# Patient Record
Sex: Female | Born: 1950 | Race: White | Hispanic: No | Smoking: Former smoker
Health system: Southern US, Community
[De-identification: ages and names within clinical notes are randomized; demographics above are authoritative.]

## PROBLEM LIST (undated history)

## (undated) ENCOUNTER — Ambulatory Visit (HOSPITAL_BASED_OUTPATIENT_CLINIC_OR_DEPARTMENT_OTHER): Admission: EM | Source: Home / Self Care

## (undated) DIAGNOSIS — Z789 Other specified health status: Secondary | ICD-10-CM

## (undated) DIAGNOSIS — F329 Major depressive disorder, single episode, unspecified: Secondary | ICD-10-CM

## (undated) DIAGNOSIS — E119 Type 2 diabetes mellitus without complications: Secondary | ICD-10-CM

## (undated) DIAGNOSIS — E785 Hyperlipidemia, unspecified: Secondary | ICD-10-CM

## (undated) DIAGNOSIS — K219 Gastro-esophageal reflux disease without esophagitis: Secondary | ICD-10-CM

## (undated) DIAGNOSIS — F32A Depression, unspecified: Secondary | ICD-10-CM

## (undated) DIAGNOSIS — I519 Heart disease, unspecified: Secondary | ICD-10-CM

## (undated) DIAGNOSIS — I2510 Atherosclerotic heart disease of native coronary artery without angina pectoris: Secondary | ICD-10-CM

## (undated) DIAGNOSIS — I6529 Occlusion and stenosis of unspecified carotid artery: Secondary | ICD-10-CM

## (undated) DIAGNOSIS — J449 Chronic obstructive pulmonary disease, unspecified: Secondary | ICD-10-CM

## (undated) DIAGNOSIS — F419 Anxiety disorder, unspecified: Secondary | ICD-10-CM

## (undated) DIAGNOSIS — K589 Irritable bowel syndrome without diarrhea: Secondary | ICD-10-CM

## (undated) DIAGNOSIS — I739 Peripheral vascular disease, unspecified: Secondary | ICD-10-CM

## (undated) DIAGNOSIS — M1990 Unspecified osteoarthritis, unspecified site: Secondary | ICD-10-CM

## (undated) DIAGNOSIS — E1169 Type 2 diabetes mellitus with other specified complication: Secondary | ICD-10-CM

## (undated) DIAGNOSIS — M797 Fibromyalgia: Secondary | ICD-10-CM

## (undated) DIAGNOSIS — E538 Deficiency of other specified B group vitamins: Secondary | ICD-10-CM

## (undated) DIAGNOSIS — C801 Malignant (primary) neoplasm, unspecified: Secondary | ICD-10-CM

## (undated) DIAGNOSIS — H3530 Unspecified macular degeneration: Secondary | ICD-10-CM

## (undated) DIAGNOSIS — Z6836 Body mass index (BMI) 36.0-36.9, adult: Secondary | ICD-10-CM

## (undated) DIAGNOSIS — I10 Essential (primary) hypertension: Secondary | ICD-10-CM

## (undated) HISTORY — DX: Body mass index (BMI) 36.0-36.9, adult: Z68.36

## (undated) HISTORY — DX: Atherosclerotic heart disease of native coronary artery without angina pectoris: I25.10

## (undated) HISTORY — DX: Peripheral vascular disease, unspecified: I73.9

## (undated) HISTORY — PX: CARDIAC CATHETERIZATION: SHX172

## (undated) HISTORY — DX: Malignant (primary) neoplasm, unspecified: C80.1

## (undated) HISTORY — PX: SKIN BIOPSY: SHX1

## (undated) HISTORY — DX: Type 2 diabetes mellitus without complications: E11.9

## (undated) HISTORY — DX: Unspecified osteoarthritis, unspecified site: M19.90

## (undated) HISTORY — PX: SMALL INTESTINE SURGERY: SHX150

## (undated) HISTORY — DX: Fibromyalgia: M79.7

## (undated) HISTORY — DX: Unspecified macular degeneration: H35.30

## (undated) HISTORY — DX: Irritable bowel syndrome, unspecified: K58.9

## (undated) HISTORY — PX: OTHER SURGICAL HISTORY: SHX169

## (undated) HISTORY — DX: Occlusion and stenosis of unspecified carotid artery: I65.29

## (undated) HISTORY — DX: Heart disease, unspecified: I51.9

## (undated) HISTORY — DX: Type 2 diabetes mellitus with other specified complication: E11.69

## (undated) HISTORY — PX: TUBAL LIGATION: SHX77

## (undated) HISTORY — DX: Other specified health status: Z78.9

## (undated) HISTORY — DX: Deficiency of other specified B group vitamins: E53.8

## (undated) HISTORY — PX: EYE SURGERY: SHX253

## (undated) HISTORY — DX: Type 2 diabetes mellitus with other specified complication: E78.5

---
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
## 2003-08-18 ENCOUNTER — Encounter: Admission: RE | Admit: 2003-08-18 | Discharge: 2003-08-18 | Payer: Self-pay | Admitting: Obstetrics and Gynecology

## 2015-07-08 DIAGNOSIS — Z95820 Peripheral vascular angioplasty status with implants and grafts: Secondary | ICD-10-CM

## 2015-07-08 DIAGNOSIS — E669 Obesity, unspecified: Secondary | ICD-10-CM | POA: Insufficient documentation

## 2015-07-08 DIAGNOSIS — M21611 Bunion of right foot: Secondary | ICD-10-CM | POA: Insufficient documentation

## 2015-07-08 DIAGNOSIS — D649 Anemia, unspecified: Secondary | ICD-10-CM

## 2015-07-08 DIAGNOSIS — M545 Low back pain, unspecified: Secondary | ICD-10-CM

## 2015-07-08 DIAGNOSIS — G5793 Unspecified mononeuropathy of bilateral lower limbs: Secondary | ICD-10-CM

## 2015-07-08 DIAGNOSIS — Z9989 Dependence on other enabling machines and devices: Secondary | ICD-10-CM | POA: Insufficient documentation

## 2015-07-08 DIAGNOSIS — R911 Solitary pulmonary nodule: Secondary | ICD-10-CM | POA: Insufficient documentation

## 2015-07-08 DIAGNOSIS — I771 Stricture of artery: Secondary | ICD-10-CM

## 2015-07-08 DIAGNOSIS — I6523 Occlusion and stenosis of bilateral carotid arteries: Secondary | ICD-10-CM | POA: Insufficient documentation

## 2015-07-08 HISTORY — DX: Obesity, unspecified: E66.9

## 2015-07-08 HISTORY — DX: Occlusion and stenosis of bilateral carotid arteries: I65.23

## 2015-07-08 HISTORY — DX: Dependence on other enabling machines and devices: Z99.89

## 2015-07-08 HISTORY — DX: Bunion of right foot: M21.611

## 2015-07-08 HISTORY — DX: Solitary pulmonary nodule: R91.1

## 2015-07-08 HISTORY — DX: Unspecified mononeuropathy of bilateral lower limbs: G57.93

## 2015-07-08 HISTORY — DX: Stricture of artery: I77.1

## 2015-07-08 HISTORY — DX: Peripheral vascular angioplasty status with implants and grafts: Z95.820

## 2015-07-08 HISTORY — DX: Anemia, unspecified: D64.9

## 2015-07-08 HISTORY — DX: Low back pain, unspecified: M54.50

## 2015-12-05 DIAGNOSIS — E1141 Type 2 diabetes mellitus with diabetic mononeuropathy: Secondary | ICD-10-CM

## 2015-12-05 HISTORY — DX: Type 2 diabetes mellitus with diabetic mononeuropathy: E11.41

## 2016-02-21 DIAGNOSIS — G2581 Restless legs syndrome: Secondary | ICD-10-CM

## 2016-02-21 HISTORY — DX: Restless legs syndrome: G25.81

## 2017-08-28 DIAGNOSIS — L84 Corns and callosities: Secondary | ICD-10-CM | POA: Insufficient documentation

## 2017-08-28 HISTORY — DX: Corns and callosities: L84

## 2018-08-11 DIAGNOSIS — I6523 Occlusion and stenosis of bilateral carotid arteries: Secondary | ICD-10-CM | POA: Insufficient documentation

## 2018-08-11 HISTORY — DX: Occlusion and stenosis of bilateral carotid arteries: I65.23

## 2018-11-03 ENCOUNTER — Telehealth: Payer: Self-pay | Admitting: Neurology

## 2018-11-03 ENCOUNTER — Encounter: Payer: Self-pay | Admitting: Neurology

## 2018-11-03 NOTE — Telephone Encounter (Signed)
Called the patient to inform them that our office has placed new protocols in place for our office visits. Due to Covid 19 our office is reducing our number of office visits in order to minimize the risk to our patients and healthcare providers.Our office is now providing the capability to offer the patients virtual visits at this time. Informed of what that process looks like and informed that the Virtual visit will still be billed through insurance as such. Due to [place],informed the patient since the appointment is taking place over the phone/internet app, we can't guarantee the security of the phone line. With that said if we do move forward I would have to get verbal consent to completed the call over the phone. Patient gave verbal consent to move forward with the video visit. I have reviewed the patient's chart and made sure that everything is up to date. Patient is also made aware that since this is a video visit we are able to complete the visit but a physical exam is not able to be done since the patient is not present in person. Pt's email is [email or web address]. Pt understands that the cis[employer] webex software must be downloaded and operational on the device pt plans to use for the visit. Pt verbalized understanding of this information and will states to be ready for the visit at least 15 min prior to the visit.

## 2018-11-10 ENCOUNTER — Encounter: Payer: Self-pay | Admitting: Neurology

## 2018-11-10 ENCOUNTER — Ambulatory Visit (INDEPENDENT_AMBULATORY_CARE_PROVIDER_SITE_OTHER): Payer: Medicare Other | Admitting: Neurology

## 2018-11-10 DIAGNOSIS — J449 Chronic obstructive pulmonary disease, unspecified: Secondary | ICD-10-CM

## 2018-11-10 DIAGNOSIS — E113293 Type 2 diabetes mellitus with mild nonproliferative diabetic retinopathy without macular edema, bilateral: Secondary | ICD-10-CM

## 2018-11-10 DIAGNOSIS — Z959 Presence of cardiac and vascular implant and graft, unspecified: Secondary | ICD-10-CM

## 2018-11-10 DIAGNOSIS — I739 Peripheral vascular disease, unspecified: Secondary | ICD-10-CM

## 2018-11-10 DIAGNOSIS — E1142 Type 2 diabetes mellitus with diabetic polyneuropathy: Secondary | ICD-10-CM

## 2018-11-10 DIAGNOSIS — Z789 Other specified health status: Secondary | ICD-10-CM

## 2018-11-10 DIAGNOSIS — I6523 Occlusion and stenosis of bilateral carotid arteries: Secondary | ICD-10-CM

## 2018-11-10 DIAGNOSIS — J4489 Other specified chronic obstructive pulmonary disease: Secondary | ICD-10-CM

## 2018-11-10 DIAGNOSIS — G2581 Restless legs syndrome: Secondary | ICD-10-CM

## 2018-11-10 HISTORY — DX: Presence of cardiac and vascular implant and graft, unspecified: Z95.9

## 2018-11-10 HISTORY — DX: Other specified chronic obstructive pulmonary disease: J44.89

## 2018-11-10 HISTORY — DX: Type 2 diabetes mellitus with mild nonproliferative diabetic retinopathy without macular edema, bilateral: E11.3293

## 2018-11-10 HISTORY — DX: Type 2 diabetes mellitus with diabetic polyneuropathy: E11.42

## 2018-11-10 HISTORY — DX: Chronic obstructive pulmonary disease, unspecified: J44.9

## 2018-11-10 HISTORY — DX: Other specified health status: Z78.9

## 2018-11-10 HISTORY — DX: Peripheral vascular disease, unspecified: I73.9

## 2018-11-10 MED ORDER — TRAZODONE HCL 50 MG PO TABS: 25.0000 mg | ORAL_TABLET | Freq: Every evening | ORAL | 5 refills | Status: DC | PRN

## 2018-11-10 NOTE — Patient Instructions (Signed)
The problem of recurrent insomnia is discussed. Avoidance of caffeine sources is strongly encouraged. Sleep hygiene issues are reviewed.  I ordered trazodone as a sleep aid for this patient, 25 mg nightly prn.   Please remember to try to maintain good sleep hygiene, which means:  Keep a regular sleep and wake schedule, try not to exercise or have a meal within 2 hours of your bedtime, try to keep your bedroom conducive for sleep, that is, cool and dark, without light distractors such as an illuminated alarm clock, and refrain from watching TV right before sleep or in the middle of the night and do not keep the TV or radio on during the night.   Also, try not to use or play on electronic devices at bedtime, such as your cell phone, tablet PC or laptop. If you like to read at bedtime on an electronic device, try to dim the background light as much as possible. Do not eat in the middle of the night.   We will request a sleep study.    We will look for  snoring or sleep apnea, but also hypoxemia. .   For chronic insomnia, you are best followed by a psychiatrist and/or sleep psychologist.   We will call you with the sleep study results and make a follow up appointment if needed. The reuslt will be shared with Dr. Wilburn Cornelia, MD.
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
## 2018-11-21 ENCOUNTER — Encounter (HOSPITAL_COMMUNITY): Payer: Self-pay | Admitting: Internal Medicine

## 2018-11-21 ENCOUNTER — Observation Stay (HOSPITAL_COMMUNITY)
Admission: AD | Admit: 2018-11-21 | Discharge: 2018-11-23 | Disposition: A | Discharge status: Home / Self Care | Payer: Medicare Other | Source: Other Acute Inpatient Hospital | Attending: Internal Medicine | Admitting: Internal Medicine

## 2018-11-21 ENCOUNTER — Other Ambulatory Visit: Payer: Self-pay

## 2018-11-21 DIAGNOSIS — Z85828 Personal history of other malignant neoplasm of skin: Secondary | ICD-10-CM | POA: Insufficient documentation

## 2018-11-21 DIAGNOSIS — G4733 Obstructive sleep apnea (adult) (pediatric): Secondary | ICD-10-CM | POA: Insufficient documentation

## 2018-11-21 DIAGNOSIS — Z79899 Other long term (current) drug therapy: Secondary | ICD-10-CM | POA: Diagnosis not present

## 2018-11-21 DIAGNOSIS — J449 Chronic obstructive pulmonary disease, unspecified: Secondary | ICD-10-CM | POA: Diagnosis not present

## 2018-11-21 DIAGNOSIS — M810 Age-related osteoporosis without current pathological fracture: Secondary | ICD-10-CM | POA: Diagnosis not present

## 2018-11-21 DIAGNOSIS — M797 Fibromyalgia: Secondary | ICD-10-CM | POA: Insufficient documentation

## 2018-11-21 DIAGNOSIS — Z87891 Personal history of nicotine dependence: Secondary | ICD-10-CM | POA: Insufficient documentation

## 2018-11-21 DIAGNOSIS — K589 Irritable bowel syndrome without diarrhea: Secondary | ICD-10-CM | POA: Insufficient documentation

## 2018-11-21 DIAGNOSIS — Z6834 Body mass index (BMI) 34.0-34.9, adult: Secondary | ICD-10-CM | POA: Insufficient documentation

## 2018-11-21 DIAGNOSIS — E119 Type 2 diabetes mellitus without complications: Secondary | ICD-10-CM

## 2018-11-21 DIAGNOSIS — E1151 Type 2 diabetes mellitus with diabetic peripheral angiopathy without gangrene: Secondary | ICD-10-CM | POA: Diagnosis not present

## 2018-11-21 DIAGNOSIS — F419 Anxiety disorder, unspecified: Secondary | ICD-10-CM | POA: Insufficient documentation

## 2018-11-21 DIAGNOSIS — Z955 Presence of coronary angioplasty implant and graft: Secondary | ICD-10-CM | POA: Insufficient documentation

## 2018-11-21 DIAGNOSIS — J4489 Other specified chronic obstructive pulmonary disease: Secondary | ICD-10-CM | POA: Diagnosis present

## 2018-11-21 DIAGNOSIS — E113293 Type 2 diabetes mellitus with mild nonproliferative diabetic retinopathy without macular edema, bilateral: Secondary | ICD-10-CM | POA: Diagnosis present

## 2018-11-21 DIAGNOSIS — I2510 Atherosclerotic heart disease of native coronary artery without angina pectoris: Secondary | ICD-10-CM | POA: Diagnosis not present

## 2018-11-21 DIAGNOSIS — Z7951 Long term (current) use of inhaled steroids: Secondary | ICD-10-CM | POA: Diagnosis not present

## 2018-11-21 DIAGNOSIS — I739 Peripheral vascular disease, unspecified: Secondary | ICD-10-CM | POA: Diagnosis present

## 2018-11-21 DIAGNOSIS — J302 Other seasonal allergic rhinitis: Secondary | ICD-10-CM | POA: Insufficient documentation

## 2018-11-21 DIAGNOSIS — U071 COVID-19: Secondary | ICD-10-CM

## 2018-11-21 DIAGNOSIS — Z959 Presence of cardiac and vascular implant and graft, unspecified: Secondary | ICD-10-CM

## 2018-11-21 DIAGNOSIS — F329 Major depressive disorder, single episode, unspecified: Secondary | ICD-10-CM

## 2018-11-21 DIAGNOSIS — E1142 Type 2 diabetes mellitus with diabetic polyneuropathy: Secondary | ICD-10-CM | POA: Diagnosis present

## 2018-11-21 DIAGNOSIS — Z7902 Long term (current) use of antithrombotics/antiplatelets: Secondary | ICD-10-CM | POA: Insufficient documentation

## 2018-11-21 DIAGNOSIS — R0600 Dyspnea, unspecified: Secondary | ICD-10-CM

## 2018-11-21 DIAGNOSIS — Z9119 Patient's noncompliance with other medical treatment and regimen: Secondary | ICD-10-CM | POA: Diagnosis not present

## 2018-11-21 DIAGNOSIS — J9601 Acute respiratory failure with hypoxia: Secondary | ICD-10-CM | POA: Insufficient documentation

## 2018-11-21 DIAGNOSIS — J069 Acute upper respiratory infection, unspecified: Secondary | ICD-10-CM

## 2018-11-21 DIAGNOSIS — Z7984 Long term (current) use of oral hypoglycemic drugs: Secondary | ICD-10-CM | POA: Insufficient documentation

## 2018-11-21 DIAGNOSIS — Z789 Other specified health status: Secondary | ICD-10-CM | POA: Diagnosis present

## 2018-11-21 HISTORY — DX: COVID-19: U07.1

## 2018-11-21 LAB — GLUCOSE, CAPILLARY: Glucose-Capillary: 89 mg/dL (ref 70–99)

## 2018-11-21 MED ORDER — ENOXAPARIN SODIUM 40 MG/0.4ML ~~LOC~~ SOLN
40.0000 mg | SUBCUTANEOUS | Status: DC
  Administered 2018-11-22 – 2018-11-23 (×2): 40 mg via SUBCUTANEOUS
  Filled 2018-11-21 (×2): qty 0.4

## 2018-11-21 MED ORDER — LORATADINE 10 MG PO TABS
10.0000 mg | ORAL_TABLET | Freq: Every day | ORAL | Status: DC
  Administered 2018-11-22 – 2018-11-23 (×2): 10 mg via ORAL
  Filled 2018-11-21 (×4): qty 1

## 2018-11-21 MED ORDER — FLUTICASONE PROPIONATE 50 MCG/ACT NA SUSP
1.0000 | Freq: Every day | NASAL | Status: DC
  Administered 2018-11-22 – 2018-11-23 (×2): 1 via NASAL
  Filled 2018-11-21: qty 16

## 2018-11-21 MED ORDER — ONDANSETRON HCL 4 MG PO TABS: 4.0000 mg | ORAL_TABLET | Freq: Four times a day (QID) | ORAL | Status: DC | PRN

## 2018-11-21 MED ORDER — PANTOPRAZOLE SODIUM 40 MG PO TBEC
40.0000 mg | DELAYED_RELEASE_TABLET | Freq: Every day | ORAL | Status: DC
  Administered 2018-11-22 – 2018-11-23 (×2): 40 mg via ORAL
  Filled 2018-11-21 (×2): qty 1

## 2018-11-21 MED ORDER — SODIUM CHLORIDE 0.9% FLUSH
3.0000 mL | Freq: Two times a day (BID) | INTRAVENOUS | Status: DC
  Administered 2018-11-21 – 2018-11-23 (×4): 3 mL via INTRAVENOUS

## 2018-11-21 MED ORDER — INSULIN ASPART 100 UNIT/ML ~~LOC~~ SOLN
0.0000 [IU] | Freq: Three times a day (TID) | SUBCUTANEOUS | Status: DC
  Administered 2018-11-23: 1 [IU] via SUBCUTANEOUS

## 2018-11-21 MED ORDER — CYCLOSPORINE 0.05 % OP EMUL
1.0000 [drp] | Freq: Two times a day (BID) | OPHTHALMIC | Status: DC
  Administered 2018-11-22 – 2018-11-23 (×3): 1 [drp] via OPHTHALMIC
  Filled 2018-11-21 (×7): qty 30

## 2018-11-21 MED ORDER — TRAZODONE HCL 50 MG PO TABS
25.0000 mg | ORAL_TABLET | Freq: Every evening | ORAL | Status: DC | PRN
  Administered 2018-11-22: 50 mg via ORAL
  Filled 2018-11-21: qty 1

## 2018-11-21 MED ORDER — OMEGA-3-ACID ETHYL ESTERS 1 G PO CAPS
1.0000 g | ORAL_CAPSULE | Freq: Every day | ORAL | Status: DC
  Administered 2018-11-22 – 2018-11-23 (×2): 1 g via ORAL
  Filled 2018-11-21 (×4): qty 1

## 2018-11-21 MED ORDER — CLOPIDOGREL BISULFATE 75 MG PO TABS
75.0000 mg | ORAL_TABLET | Freq: Every day | ORAL | Status: DC
  Administered 2018-11-22 – 2018-11-23 (×2): 75 mg via ORAL
  Filled 2018-11-21 (×4): qty 1

## 2018-11-21 MED ORDER — INSULIN ASPART 100 UNIT/ML ~~LOC~~ SOLN: 0.0000 [IU] | Freq: Every day | SUBCUTANEOUS | Status: DC

## 2018-11-21 MED ORDER — VENLAFAXINE HCL ER 75 MG PO CP24
75.0000 mg | ORAL_CAPSULE | Freq: Every day | ORAL | Status: DC
  Administered 2018-11-22 – 2018-11-23 (×2): 75 mg via ORAL
  Filled 2018-11-21 (×4): qty 1

## 2018-11-21 MED ORDER — VITAMIN C 500 MG PO TABS
1000.0000 mg | ORAL_TABLET | Freq: Every day | ORAL | Status: DC
  Administered 2018-11-22 – 2018-11-23 (×2): 1000 mg via ORAL
  Filled 2018-11-21 (×2): qty 2

## 2018-11-21 MED ORDER — SODIUM CHLORIDE 0.9 % IV SOLN: 250.0000 mL | INTRAVENOUS | Status: DC | PRN

## 2018-11-21 MED ORDER — POLYETHYLENE GLYCOL 3350 17 G PO PACK: 17.0000 g | PACK | Freq: Every day | ORAL | Status: DC | PRN

## 2018-11-21 MED ORDER — SODIUM CHLORIDE 0.9% FLUSH: 3.0000 mL | INTRAVENOUS | Status: DC | PRN

## 2018-11-21 MED ORDER — ACETAMINOPHEN 325 MG PO TABS
650.0000 mg | ORAL_TABLET | Freq: Four times a day (QID) | ORAL | Status: DC | PRN
  Administered 2018-11-21 – 2018-11-22 (×3): 650 mg via ORAL
  Filled 2018-11-21 (×4): qty 2

## 2018-11-21 MED ORDER — ONDANSETRON HCL 4 MG/2ML IJ SOLN: 4.0000 mg | Freq: Four times a day (QID) | INTRAMUSCULAR | Status: DC | PRN

## 2018-11-21 NOTE — H&P (Signed)
History and Physical    Lori Byrd GNF:[id number] DOB: 1951/07/06 DOA: 11/21/2018  PCP: Lori Oman, DO   Patient coming from: [facility] ER   Chief Complaint: Shortness of breath on exertion  HPI: Lori Byrd is a 68 y.o. female with medical history significant of type 2 diabetes on oral hypoglycemics, osteoporosis, peripheral vascular disease status post carotid stent as well as aortic stent (?  TAVR), fibromyalgia, obstructive sleep apnea noncompliant on CPAP, irritable bowel syndrome, COPD, depression/anxiety who presents from home/[place] ER with subjective shortness of breath in the setting of recent diagnosis of COVID-19.  Patient reports she is feeling fairly well until Thursday when she began to notice cough, congestion, shortness of breath on exertion as well as diffuse malaise and fatigue.  She was diagnosed with COVID-19.  She reports that at home her symptoms been fairly stable but she has had persistent shortness of breath on exertion that was getting worse.  She denies any chest pain, bowel pain, nausea, vomiting, diarrhea, syncope/presyncope, orthopnea, lower extremity edema.  ED Course: In the ER patient's vitals were fairly unremarkable.  Her labs were fairly reassuring.  Initially patient was called as a consult and was deemed appropriate for discharge home however on ambulation patient's oxygen saturations dropped to 88% ABG showed a PaO2 of 73.  Patient was transferred here for monitoring.  Review of Systems: As per HPI otherwise 10 point review of systems negative.    Past Medical History:  Diagnosis Date  . Cancer ([place])    skin cancer  . Coronary artery disease   . Diabetes mellitus without complication ([place])   . Fibromyalgia   . IBS (irritable bowel syndrome)     Past Surgical History:  Procedure Laterality Date  . SKIN BIOPSY    . SMALL INTESTINE SURGERY    . TUBAL LIGATION    . two carotid stent       reports that she has  quit smoking. Her smoking use included cigarettes. She has never used smokeless tobacco. She reports previous alcohol use. She reports previous drug use.  Allergies  Allergen Reactions  . Asa [Aspirin]   . Grass Extracts [Gramineae Pollens]   . Morphine And Related   . Toradol [Ketorolac Tromethamine]     Family History  Problem Relation Age of Onset  . Heart failure Mother   . Diabetes Mother   . Diabetes Maternal Grandmother    Prior to Admission medications   Medication Sig Start Date End Date Taking? Authorizing Provider  alendronate (FOSAMAX) 70 MG tablet once a week. 10/21/18   [provider]  amitriptyline (ELAVIL) 10 MG tablet Take 10 mg by mouth at bedtime as needed. 10/25/18   [provider]  Ascorbic Acid (VITAMIN C) 1000 MG tablet Take 1,000 mg by mouth daily.    [provider]  clopidogrel (PLAVIX) 75 MG tablet daily. 08/20/18   [provider]  fexofenadine (ALLEGRA) 180 MG tablet Take 180 mg by mouth daily.    [provider]  fluticasone (FLONASE) 50 MCG/ACT nasal spray as needed. 08/05/18   [provider]  metFORMIN (GLUCOPHAGE) 500 MG tablet 2 (two) times daily. 10/07/18   [provider]  Multiple Vitamin (MULTI-VITAMIN PO) Take by mouth.    [provider]  Omega-3 Fatty Acids (FISH OIL CONCENTRATE PO) Take by mouth daily.    [provider]  pantoprazole (PROTONIX) 40 MG tablet daily. 08/02/18   [provider]  Probiotic Product (  PROBIOTIC-10 PO) Take by mouth.    [provider]  RESTASIS 0.05 % ophthalmic emulsion Place 1 drop into both eyes 2 (two) times daily. 10/26/18   [provider]  traZODone (DESYREL) 50 MG tablet Take 0.5-1 tablets (25-50 mg total) by mouth at bedtime as needed for sleep. 11/10/18   Dohmeier, Asencion Partridge, MD  venlafaxine XR (EFFEXOR-XR) 75 MG 24 hr capsule daily. 09/08/18   [provider]  VENTOLIN HFA 108 (90 Base) MCG/ACT inhaler   08/14/18   [provider]    Physical Exam: There were no vitals filed for this visit.  Constitutional: NAD, calm, comfortable There were no vitals filed for this visit. Eyes: Anicteric sclera ENMT: Poor dentition.  Neck: [place], no carotid bruits Respiratory: No increased work of breathing, clear to auscultation bilaterally, no wheezes, rhonchi, rales.  Cardiovascular: Regular rate and rhythm, no murmurs Abdomen: no tenderness, no masses palpated. No hepatosplenomegaly. Bowel sounds positive.  Musculoskeletal: No lower extremity edema, diminished hair over legs Skin: no rashes over visible skin, well-healed incision over carotid Neurologic: Grossly intact, moving all extremities Psychiatric: Normal judgment and insight. Alert and oriented x 3. Normal mood.    Labs on Admission: I have personally reviewed following labs and imaging studies  CBC: No results for input(s): WBC, NEUTROABS, HGB, HCT, MCV, PLT in the last 168 hours. Basic Metabolic Panel: No results for input(s): NA, K, CL, CO2, GLUCOSE, BUN, CREATININE, CALCIUM, MG, PHOS in the last 168 hours. GFR: CrCl cannot be calculated (No successful lab value found.). Liver Function Tests: No results for input(s): AST, ALT, ALKPHOS, BILITOT, PROT, ALBUMIN in the last 168 hours. No results for input(s): LIPASE, AMYLASE in the last 168 hours. No results for input(s): AMMONIA in the last 168 hours. Coagulation Profile: No results for input(s): INR, PROTIME in the last 168 hours. Cardiac Enzymes: No results for input(s): CKTOTAL, CKMB, CKMBINDEX, TROPONINI in the last 168 hours. BNP (last 3 results) No results for input(s): PROBNP in the last 8760 hours. HbA1C: No results for input(s): HGBA1C in the last 72 hours. CBG: No results for input(s): GLUCAP in the last 168 hours. Lipid Profile: No results for input(s): CHOL, HDL, LDLCALC, TRIG, CHOLHDL, LDLDIRECT in the last 72 hours. Thyroid Function Tests: No  results for input(s): TSH, T4TOTAL, FREET4, T3FREE, THYROIDAB in the last 72 hours. Anemia Panel: No results for input(s): VITAMINB12, FOLATE, FERRITIN, TIBC, IRON, RETICCTPCT in the last 72 hours. Urine analysis: No results found for: COLORURINE, APPEARANCEUR, LABSPEC, PHURINE, GLUCOSEU, HGBUR, BILIRUBINUR, KETONESUR, PROTEINUR, UROBILINOGEN, NITRITE, LEUKOCYTESUR  Radiological Exams on Admission: No results found.  EKG: Independently reviewed. 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  She is currently on room air.  She is not tachypneic.  On review of the chart patient had no evidence of infiltrates on x-ray at [facility] and labs were unremarkable. -We will check LDH, CRP, d-dimer, ferritin tomorrow - Airborne droplet precautions -Can likely discharge tomorrow if stable  #) COPD/seasonal allergies: Patient is currently not wheezing -Continue inhaled prednisone -  We will not order albuterol MDI at this time - Continue fexofenadine 180 mg daily  #) Osteoporosis: -Hold alendronate  #) Type diabetes: -Hold metformin 500 mg twice daily -Sliding scale insulin, AC at bedtime  #) Peripheral vascular disease status post aortic and carotid stent: -Continue clopidogrel 75 mg daily  #) Pain/psych: -Continue venlafaxine 75 mg daily -Continue amitriptyline 10 mg nightly as needed  #) OSA: Patient is pending outpatient surgery for this as she has not tolerated CPAP in the  past  Fluids: Tolerating p.o. Electrolytes: Monitor and supplement Nutrition: Carb restricted and heart healthy diet  Prophylaxis: Enoxaparin   Disposition: Pending monitoring overnight  Full code    Cristy Folks MD [facility]   If 7PM-7AM, please contact night-coverage [email or web address] Password [facility]  11/21/2018, 9:45 PM

## 2018-11-22 DIAGNOSIS — E1142 Type 2 diabetes mellitus with diabetic polyneuropathy: Secondary | ICD-10-CM | POA: Diagnosis not present

## 2018-11-22 DIAGNOSIS — I739 Peripheral vascular disease, unspecified: Secondary | ICD-10-CM | POA: Diagnosis not present

## 2018-11-22 DIAGNOSIS — Z789 Other specified health status: Secondary | ICD-10-CM | POA: Diagnosis not present

## 2018-11-22 DIAGNOSIS — U071 COVID-19: Secondary | ICD-10-CM | POA: Diagnosis not present

## 2018-11-22 LAB — COMPREHENSIVE METABOLIC PANEL
ALT: 15 U/L (ref 0–44)
AST: 23 U/L (ref 15–41)
Albumin: 3.5 g/dL (ref 3.5–5.0)
Alkaline Phosphatase: 54 U/L (ref 38–126)
Anion gap: 11 (ref 5–15)
BUN: 21 mg/dL (ref 8–23)
CO2: 27 mmol/L (ref 22–32)
Calcium: 9.3 mg/dL (ref 8.9–10.3)
Chloride: 104 mmol/L (ref 98–111)
Creatinine, Ser: 0.89 mg/dL (ref 0.44–1.00)
GFR calc Af Amer: >60 mL/min (ref >60)
GFR calc non Af Amer: >60 mL/min (ref >60)
Glucose, Bld: 110 mg/dL — ABNORMAL HIGH (ref 70–99)
Potassium: 3.8 mmol/L (ref 3.5–5.1)
Sodium: 142 mmol/L (ref 135–145)
Total Bilirubin: 0.3 mg/dL (ref 0.3–1.2)
Total Protein: 6.5 g/dL (ref 6.5–8.1)

## 2018-11-22 LAB — CBC WITH DIFFERENTIAL/PLATELET
Abs Immature Granulocytes: 0.03 10*3/uL (ref 0.00–0.07)
Basophils Absolute: 0 10*3/uL (ref 0.0–0.1)
Basophils Relative: 0 %
Eosinophils Absolute: 0.3 10*3/uL (ref 0.0–0.5)
Eosinophils Relative: 5 %
HCT: 41 % (ref 36.0–46.0)
Hemoglobin: 13.3 g/dL (ref 12.0–15.0)
Immature Granulocytes: 1 %
Lymphocytes Relative: 35 %
Lymphs Abs: 1.8 10*3/uL (ref 0.7–4.0)
MCH: 30.5 pg (ref 26.0–34.0)
MCHC: 32.4 g/dL (ref 30.0–36.0)
MCV: 94 fL (ref 80.0–100.0)
Monocytes Absolute: 0.6 10*3/uL (ref 0.1–1.0)
Monocytes Relative: 11 %
Neutro Abs: 2.5 10*3/uL (ref 1.7–7.7)
Neutrophils Relative %: 48 %
Platelets: 238 10*3/uL (ref 150–400)
RBC: 4.36 MIL/uL (ref 3.87–5.11)
RDW: 13.5 % (ref 11.5–15.5)
WBC: 5.1 10*3/uL (ref 4.0–10.5)
nRBC: 0 % (ref 0.0–0.2)

## 2018-11-22 LAB — FERRITIN
Ferritin: 13 ng/mL (ref 11–307)
Ferritin: 15 ng/mL (ref 11–307)

## 2018-11-22 LAB — C-REACTIVE PROTEIN
CRP: <0.8 mg/dL (ref <1.0)
CRP: <0.8 mg/dL (ref <1.0)

## 2018-11-22 LAB — GLUCOSE, CAPILLARY
Glucose-Capillary: 116 mg/dL — ABNORMAL HIGH (ref 70–99)
Glucose-Capillary: 136 mg/dL — ABNORMAL HIGH (ref 70–99)
Glucose-Capillary: 98 mg/dL (ref 70–99)
Glucose-Capillary: 197 mg/dL — ABNORMAL HIGH (ref 70–99)

## 2018-11-22 LAB — MAGNESIUM: Magnesium: 2.1 mg/dL (ref 1.7–2.4)

## 2018-11-22 LAB — BRAIN NATRIURETIC PEPTIDE: B Natriuretic Peptide: 18.7 pg/mL (ref 0.0–100.0)

## 2018-11-22 LAB — ABO/RH: ABO/RH(D): O NEG

## 2018-11-22 LAB — D-DIMER, QUANTITATIVE: D-Dimer, Quant: 0.3 ug/mL-FEU (ref 0.00–0.50)

## 2018-11-22 NOTE — Plan of Care (Signed)
Patient lying in bed this morning. Alert and oriented with no concerns or complaints noted. States Tylenol for headache helped earlier. Will continue to monitor.

## 2018-11-22 NOTE — Care Management (Signed)
Case manager will continue to monitor for disposition.      Ricki Miller, RN BSN Case Manager [phone]

## 2018-11-22 NOTE — Plan of Care (Signed)
Patient is not experiencing any respiratory concerns at the moment.

## 2018-11-22 NOTE — Progress Notes (Signed)
PROGRESS NOTE                                                                                                                                                                                                             Patient Demographics:    Lori Byrd, is a 68 y.o. female, DOB - 1951-07-12, WUJ:[id number]  Outpatient Primary MD for the patient is [name], DO    LOS - 1  CC - SOB     Brief Narrative  Lori Byrd is a 68 y.o. female with medical history significant of type 2 diabetes on oral hypoglycemics, osteoporosis, peripheral vascular disease status post carotid stent as well as aortic stent (?  TAVR), fibromyalgia, obstructive sleep apnea noncompliant on CPAP, irritable bowel syndrome, COPD, depression/anxiety who presents from home/[place] ER with subjective shortness of breath in the setting of recent diagnosis of COVID-19.   Subjective:    Flannery Cavallero today has, No headache, No chest pain, No abdominal pain - No Nausea, No new weakness tingling or numbness, Improved  Cough - SOB.     Assessment  & Plan :     1. Acute Hypoxic Resp. Failure due to Acute Covid 19 Viral Illness during the ongoing 2020 Covid 19 Pandemic - clinically stable, on room air once sitting up, symmetry markers so far stable, encouraged to sit up in chair advance activity.  If improves likely discharge soon.  COVID-19 Labs  Recent Labs    11/22/18 0500  FERRITIN 13  CRP <0.8    2.  Morbid obesity with OSA - follow with PCP for weight loss, currently not using CPAP.  3.  PAD with aortic and carotid artery stent.  On Plavix continue.  4.  Chronic pain and depression.  Continue combination of amitriptyline and venlafaxine.  5.  COPD with seasonal allergies.  No acute issues.  6.  DM type II.  Currently on sliding scale.  Metformin on hold.  No results found for: HGBA1C CBG (last 3)  Recent Labs     11/21/18 2213 11/22/18 0743  GLUCAP 89 116*       Condition - Fair  Family Communication  :  None  Code Status :  Full  Diet : Low Carb  Disposition Plan  :  Home in 1-2 days, unable to change to observation  due to patient being in [place].  No appropriate order in the computer.  Consults  :  None  Procedures  :  None  PUD Prophylaxis : PPI  DVT Prophylaxis  :  Lovenox   Lab Results  Component Value Date   PLT 238 11/22/2018    Inpatient Medications  Scheduled Meds: . clopidogrel  75 mg Oral Daily  . cycloSPORINE  1 drop Both Eyes BID  . enoxaparin (LOVENOX) injection  40 mg Subcutaneous Q24H  . fluticasone  1 spray Each Nare Daily  . insulin aspart  0-5 Units Subcutaneous QHS  . insulin aspart  0-9 Units Subcutaneous TID WC  . loratadine  10 mg Oral Daily  . omega-3 acid ethyl esters  1 g Oral Daily  . pantoprazole  40 mg Oral Daily  . sodium chloride flush  3 mL Intravenous Q12H  . venlafaxine XR  75 mg Oral Q breakfast  . vitamin C  1,000 mg Oral Daily   Continuous Infusions: . sodium chloride     PRN Meds:.sodium chloride, acetaminophen, ondansetron **OR** ondansetron (ZOFRAN) IV, polyethylene glycol, sodium chloride flush, traZODone  Antibiotics  :    Anti-infectives (From admission, onward)   None       Time Spent in minutes  30   Lala Lund M.D on 11/22/2018 at 11:26 AM  To page go to [email or web address] - password [email or web address]  [facility] -  Office  [phone]   See all Orders from today for further details   Admit date - 11/21/2018    1    Objective:   Vitals:   11/22/18 0700 11/22/18 0800 11/22/18 0819 11/22/18 0900  BP:   (!) 142/78   Pulse: 89 (!) 107  (!) 103  Resp:   20   Temp:   98.5 F (36.9 C)   TempSrc:   Oral   SpO2: 99% 97%  95%  Weight:        Wt Readings from Last 3 Encounters:  11/22/18 80.2 kg     Intake/Output Summary (Last 24 hours) at 11/22/2018 1126 Last data filed at 11/22/2018 0835 Gross per 24 hour   Intake 480 ml  Output -  Net 480 ml     Physical Exam  Awake Alert, Oriented X 3, No new F.N deficits, Normal affect [place].AT,PERRAL Supple Neck,No JVD, No cervical lymphadenopathy appriciated.  Symmetrical Chest wall movement, Good air movement bilaterally, CTAB RRR,No Gallops,Rubs or new Murmurs, No Parasternal Heave +ve B.Sounds, Abd Soft, No tenderness, No organomegaly appriciated, No rebound - guarding or rigidity. No Cyanosis, Clubbing or edema, No new Rash or bruise       Data Review:    CBC Recent Labs  Lab 11/22/18 0500  WBC 5.1  HGB 13.3  HCT 41.0  PLT 238  MCV 94.0  MCH 30.5  MCHC 32.4  RDW 13.5  LYMPHSABS 1.8  MONOABS 0.6  EOSABS 0.3  BASOSABS 0.0    Chemistries  Recent Labs  Lab 11/22/18 0500  NA 142  K 3.8  CL 104  CO2 27  GLUCOSE 110*  BUN 21  CREATININE 0.89  CALCIUM 9.3  MG 2.1  AST 23  ALT 15  ALKPHOS 54  BILITOT 0.3   ------------------------------------------------------------------------------------------------------------------ No results for input(s): CHOL, HDL, LDLCALC, TRIG, CHOLHDL, LDLDIRECT in the last 72 hours.  No results found for: HGBA1C ------------------------------------------------------------------------------------------------------------------ No results for input(s): TSH, T4TOTAL, T3FREE, THYROIDAB in the last 72 hours.  Invalid input(s): FREET3  Cardiac Enzymes No results for  input(s): CKMB, TROPONINI, MYOGLOBIN in the last 168 hours.  Invalid input(s): CK ------------------------------------------------------------------------------------------------------------------ No results found for: BNP  Micro Results No results found for this or any previous visit (from the past 240 hour(s)).  Radiology Reports No results found.

## 2018-11-23 DIAGNOSIS — Z959 Presence of cardiac and vascular implant and graft, unspecified: Secondary | ICD-10-CM

## 2018-11-23 DIAGNOSIS — I739 Peripheral vascular disease, unspecified: Secondary | ICD-10-CM | POA: Diagnosis not present

## 2018-11-23 DIAGNOSIS — U071 COVID-19: Secondary | ICD-10-CM | POA: Diagnosis not present

## 2018-11-23 DIAGNOSIS — E1142 Type 2 diabetes mellitus with diabetic polyneuropathy: Secondary | ICD-10-CM | POA: Diagnosis not present

## 2018-11-23 LAB — CBC WITH DIFFERENTIAL/PLATELET
Abs Immature Granulocytes: 0.01 10*3/uL (ref 0.00–0.07)
Basophils Absolute: 0 10*3/uL (ref 0.0–0.1)
Basophils Relative: 0 %
Eosinophils Absolute: 0.2 10*3/uL (ref 0.0–0.5)
Eosinophils Relative: 4 %
HCT: 40.6 % (ref 36.0–46.0)
Hemoglobin: 12.6 g/dL (ref 12.0–15.0)
Immature Granulocytes: 0 %
Lymphocytes Relative: 33 %
Lymphs Abs: 1.8 10*3/uL (ref 0.7–4.0)
MCH: 29.3 pg (ref 26.0–34.0)
MCHC: 31 g/dL (ref 30.0–36.0)
MCV: 94.4 fL (ref 80.0–100.0)
Monocytes Absolute: 0.5 K/uL (ref 0.1–1.0)
Monocytes Relative: 10 %
Neutro Abs: 2.8 10*3/uL (ref 1.7–7.7)
Neutrophils Relative %: 53 %
Platelets: 245 10*3/uL (ref 150–400)
RBC: 4.3 MIL/uL (ref 3.87–5.11)
RDW: 13.4 % (ref 11.5–15.5)
WBC: 5.4 10*3/uL (ref 4.0–10.5)
nRBC: 0 % (ref 0.0–0.2)

## 2018-11-23 LAB — COMPREHENSIVE METABOLIC PANEL
ALT: 15 U/L (ref 0–44)
AST: 23 U/L (ref 15–41)
Albumin: 3.4 g/dL — ABNORMAL LOW (ref 3.5–5.0)
Alkaline Phosphatase: 55 U/L (ref 38–126)
Anion gap: 6 (ref 5–15)
BUN: 23 mg/dL (ref 8–23)
CO2: 30 mmol/L (ref 22–32)
Calcium: 9.1 mg/dL (ref 8.9–10.3)
Chloride: 106 mmol/L (ref 98–111)
Creatinine, Ser: 0.94 mg/dL (ref 0.44–1.00)
GFR calc Af Amer: >60 mL/min (ref >60)
GFR calc non Af Amer: >60 mL/min (ref >60)
Glucose, Bld: 116 mg/dL — ABNORMAL HIGH (ref 70–99)
Potassium: 4.4 mmol/L (ref 3.5–5.1)
Sodium: 142 mmol/L (ref 135–145)
Total Bilirubin: 0.4 mg/dL (ref 0.3–1.2)
Total Protein: 6.4 g/dL — ABNORMAL LOW (ref 6.5–8.1)

## 2018-11-23 LAB — GLUCOSE, CAPILLARY
Glucose-Capillary: 114 mg/dL — ABNORMAL HIGH (ref 70–99)
Glucose-Capillary: 146 mg/dL — ABNORMAL HIGH (ref 70–99)

## 2018-11-23 LAB — C-REACTIVE PROTEIN: CRP: <0.8 mg/dL (ref <1.0)

## 2018-11-23 LAB — D-DIMER, QUANTITATIVE: D-Dimer, Quant: <0.27 ug/mL-FEU (ref 0.00–0.50)

## 2018-11-23 LAB — FERRITIN: Ferritin: 14 ng/mL (ref 11–307)

## 2018-11-23 LAB — MAGNESIUM: Magnesium: 2.1 mg/dL (ref 1.7–2.4)

## 2018-11-23 NOTE — TOC Transition Note (Addendum)
Transition of Care [facility]) - CM/SW Discharge Note   Patient Details  Name: Lori Byrd MRN: [id number] Date of Birth: 1950/12/17  Transition [facility]) CM/SW Contact:  Midge Minium RN, BSN, NCM-BC, ACM-RN [phone] Phone Number: 11/23/2018, 10:00 AM   Clinical Narrative:    68 yo female presented with SOB with recent diagnosis of COVID-19. CM spoke to the patient via phone to discuss the POC. Patient states living at home alone and being independent with her ADLs with no DME in use PTA. PCP verified as: Dr. Duwaine Maxin; Demographics verified. HH orders entered by Dr. Candiss Norse, with patient agreeable to [facility] services post transition. CMS HH Compare list discussed via phone with [facility] selected. HH referral given to Adela Lank RN liaison for [place]; AVS updated. Patient indicated her son will be available to provide transportation home. No further needs from CM.   Addendum: 11/23/18 @ 1317-Lori Byrd RNCM-Call received that the patient is unable to reach her son to arrange transportation home, with CM assistance required. CM discussed [facility] transportation with patient, and possible bill if not deemed medically appropriate per her insurance, with the patient verbalizing understanding. Medical Necessity documented by CM in Epic, with [facility] rep provided the transport information for scheduling. CM will print the required documents for [facility], with the staff at [facility] to provide to the transport team.   Final next level of care: [place] Barriers to Discharge: No Barriers Identified   Patient Goals and CMS Choice Patient states their goals for this hospitalization and ongoing recovery are:: "To get my strength back" CMS Medicare.gov Compare Post Acute Care list provided to:: Patient Choice offered to / list presented to : Patient  Discharge Placement                       Discharge Plan and Services                DME Arranged: N/A DME Agency: NA        HH Arranged: RN, Disease Management, PT, Nurse's Aide [place] Agency: [place] Date [facility] Agency Contacted: 11/23/18 Time [place]: [phone] Representative spoke with at [place]: Adela Lank RN   Social Determinants of Health (SDOH) Interventions     Readmission Risk Interventions No flowsheet data found.

## 2018-11-23 NOTE — Discharge Summary (Signed)
Lori Byrd ACZ:[id number] DOB: 10/02/1950 DOA: 11/21/2018  PCP: [name], DO  Admit date: 11/21/2018  Discharge date: 11/23/2018  Admitted From: Home  Disposition:  Home   Recommendations for Outpatient Follow-up:   Follow up with PCP in 1-2 weeks  PCP Please obtain BMP/CBC, 2 view CXR in 1week,  (see Discharge instructions)   PCP Please follow up on the following pending results:    Home Health: PT,RN, Aide   Equipment/Devices: None  Consultations: None Discharge Condition: Stable   CODE STATUS: Full   Diet Recommendation: Heart Healthy Low Carb  CC - Fever   Brief history of present illness from the day of admission and additional interim summary    Lori Byrd Baldwinis a 68 y.o.femalewith medical history significant oftype 2 diabetes on oral hypoglycemics, osteoporosis, peripheral vascular disease status post carotid stent as well as aortic stent (? TAVR), fibromyalgia, obstructive sleep apnea noncompliant on CPAP, irritable bowel syndrome, COPD, depression/anxiety who presents from home/[place] ER with subjective shortness of breath in the setting of recent diagnosis of COVID-19.                                                                 Hospital Course   1. Acute Hypoxic Resp. Failure due to Acute Covid 19 Viral Illness during the ongoing 2020 Covid 19 Pandemic - clinically stable, on room air and symptom-free, will be discharged home, home isolation instructions given in writing, will also arrange for home RN, PT and health aide.  Case management requested to assist, inflammatory markers continue to be stable.  COVID-19 Labs  Recent Labs    11/22/18 0500 11/22/18 0753 11/22/18 0754 11/23/18 0513  DDIMER  --   --  0.30 <0.27  FERRITIN 13 15  --  14  CRP <0.8 <0.8   --  <0.8    2.  Morbid obesity with OSA - follow with PCP for weight loss, currently not using CPAP.  3.  PAD with aortic and carotid artery stent.  On Plavix continue.  4.  Chronic pain and depression.  Continue combination of amitriptyline and venlafaxine.  5.  COPD with seasonal allergies.  No acute issues.  6.  DM type II.  Currently on sliding scale.  Metformin on hold.   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, without long-term current use of insulin (HCC)   COPD (chronic obstructive pulmonary disease) with chronic bronchitis (HCC)   Diabetic polyneuropathy associated with  type 2 diabetes mellitus ([place])   Status post arterial stent   COVID-19 virus infection    Discharge instructions    Discharge Instructions    Discharge instructions   Complete by:  As directed    Follow with Primary MD [name], DO in 7 days   Get CBC, CMP, 2 view Chest X ray -  checked  by Primary MD  in 5-7 days    Activity: As tolerated with Full fall precautions use walker/cane & assistance as needed  Disposition Home    Diet: Heart Healthy - Low Carb  Special Instructions: If you have smoked or chewed Tobacco  in the last 2 yrs please stop smoking, stop any regular Alcohol  and or any Recreational drug use.  On your next visit with your primary care physician please Get Medicines reviewed and adjusted.  Please request your Prim.MD to go over all Hospital Tests and Procedure/Radiological results at the follow up, please get all Hospital records sent to your Prim MD by signing hospital release before you go home.  If you experience worsening of your admission symptoms, develop  shortness of breath, life threatening emergency, suicidal or homicidal thoughts you must seek medical attention immediately by calling 911 or calling your MD immediately  if symptoms less severe.  You Must read complete instructions/literature along with all the possible adverse reactions/side effects for all the Medicines you take and that have been prescribed to you. Take any new Medicines after you have completely understood and accpet all the possible adverse reactions/side effects.   Increase activity slowly   Complete by:  As directed    MyChart COVID-19 home monitoring program   Complete by:  Nov 23, 2018    Is the patient willing to use the [place] for home monitoring?:  Yes   Temperature monitoring   Complete by:  Nov 23, 2018    After how many days would you like to receive a notification of this patient's flowsheet entries?:  1      Discharge Medications   Allergies as of 11/23/2018      Reactions   Asa [aspirin]    Grass Extracts [gramineae Pollens]    Morphine And Related    Toradol [ketorolac Tromethamine]       Medication List    TAKE these medications   alendronate 70 MG tablet Commonly known as:  FOSAMAX once a week.   amitriptyline 10 MG tablet Commonly known as:  ELAVIL Take 10 mg by mouth at bedtime as needed.   clopidogrel 75 MG tablet Commonly known as:  PLAVIX daily.   fexofenadine 180 MG tablet Commonly known as:  ALLEGRA Take 180 mg by mouth daily.   FISH OIL CONCENTRATE PO Take by mouth daily.   fluticasone 50 MCG/ACT nasal spray Commonly known as:  FLONASE as needed.   metFORMIN 500 MG tablet Commonly known as:  GLUCOPHAGE 2 (two) times daily.   MULTI-VITAMIN PO Take by mouth.   pantoprazole 40 MG tablet Commonly known as:  PROTONIX daily.   PROBIOTIC-10 PO Take by mouth.   Restasis 0.05 % ophthalmic emulsion Generic drug:  cycloSPORINE Place 1 drop into both eyes 2 (two) times daily.   traZODone 50 MG  tablet Commonly known as:  DESYREL Take 0.5-1 tablets (25-50 mg total) by mouth at bedtime as needed for sleep.   venlafaxine XR 75 MG 24 hr capsule Commonly known as:  EFFEXOR-XR daily.   Ventolin HFA 108 (90 Base) MCG/ACT  inhaler Generic drug:  albuterol   vitamin C 1000 MG tablet Take 1,000 mg by mouth daily.       Follow-up Information    [name], DO. Schedule an appointment as soon as possible for a visit in 1 week(s).   Specialty:  Internal Medicine Contact information: [address] [place] [address] [phone]           Major procedures and Radiology Reports - PLEASE review detailed and final reports thoroughly  -        No results found.  Micro Results     No results found for this or any previous visit (from the past 240 hour(s)).  Today   Subjective    Zilda No today has no headache,no chest abdominal pain,no new weakness tingling or numbness, feels much better wants to go home today.     Objective   Blood pressure (!) 154/69, pulse 90, temperature 98.7 F (37.1 C), temperature source Oral, resp. rate 20, weight 80.2 kg, SpO2 95 % on RA.   Intake/Output Summary (Last 24 hours) at 11/23/2018 0923 Last data filed at 11/23/2018 0600 Gross per 24 hour  Intake 1200 ml  Output -  Net 1200 ml    Exam  Awake Alert, Oriented x 3, No new F.N deficits, Normal affect .AT,PERRAL Supple Neck,No JVD, No cervical lymphadenopathy appriciated.  Symmetrical Chest wall movement, Good air movement bilaterally, CTAB RRR,No Gallops,Rubs or new Murmurs, No Parasternal Heave +ve B.Sounds, Abd Soft, Non tender, No organomegaly appriciated, No rebound -guarding or rigidity. No Cyanosis, Clubbing or edema, No new Rash or bruise   Data Review   CBC w Diff:  Lab Results  Component Value Date   WBC 5.4 11/23/2018   HGB 12.6 11/23/2018   HCT 40.6 11/23/2018   PLT 245 11/23/2018   LYMPHOPCT 33 11/23/2018   MONOPCT 10  11/23/2018   EOSPCT 4 11/23/2018   BASOPCT 0 11/23/2018    CMP:  Lab Results  Component Value Date   NA 142 11/23/2018   K 4.4 11/23/2018   CL 106 11/23/2018   CO2 30 11/23/2018   BUN 23 11/23/2018   CREATININE 0.94 11/23/2018   PROT 6.4 (L) 11/23/2018   ALBUMIN 3.4 (L) 11/23/2018   BILITOT 0.4 11/23/2018   ALKPHOS 55 11/23/2018   AST 23 11/23/2018   ALT 15 11/23/2018  .   Total Time in preparing paper work, data evaluation and todays exam - 28 minutes  Lala Lund M.D on 11/23/2018 at 9:23 AM  [facility]   Office  [phone]

## 2018-11-23 NOTE — Progress Notes (Signed)
Patient understands D/C and all questions answered

## 2018-11-23 NOTE — Progress Notes (Signed)
  Daughter Lenna Sciara called very upset that we had not called her, but the daughter asked [place] not to. Melissa said that her mother needed someone to meet her and that she was being evaluated for cognitive issues and that she does not always understand when she gets stressed. I made her the primary contact for this patient and all further information and questions should be directed toward her for all future hospitalizations. I gave her Lanelle Bal the case manager's number to follow up about home health.

## 2018-11-23 NOTE — Progress Notes (Signed)
Several attempts were made to contact son of patient. The patient requested that case management go ahead and set up transport instead. [facility] is here now and she said that she cannot afford this ride and son will come get her tomorrow. I said I was sorry, but we could not keep her in our care until tomorrow. She said she will take [facility] but wants to know how much it is and I said [facility] may be able to tell her.

## 2018-11-23 NOTE — Discharge Instructions (Signed)
Follow with Primary MD [name], DO in 7 days   Get CBC, CMP, 2 view Chest X ray -  checked  by Primary MD  in 5-7 days    Activity: As tolerated with Full fall precautions use walker/cane & assistance as needed  Disposition Home    Diet: Heart Healthy - Low Carb  Special Instructions: If you have smoked or chewed Tobacco  in the last 2 yrs please stop smoking, stop any regular Alcohol  and or any Recreational drug use.  On your next visit with your primary care physician please Get Medicines reviewed and adjusted.  Please request your Prim.MD to go over all Hospital Tests and Procedure/Radiological results at the follow up, please get all Hospital records sent to your Prim MD by signing hospital release before you go home.  If you experience worsening of your admission symptoms, develop shortness of breath, life threatening emergency, suicidal or homicidal thoughts you must seek medical attention immediately by calling 911 or calling your MD immediately  if symptoms less severe.  You Must read complete instructions/literature along with all the possible adverse reactions/side effects for all the Medicines you take and that have been prescribed to you. Take any new Medicines after you have completely understood and accpet all the possible adverse reactions/side effects.        Person Under Monitoring Name: Lori Byrd  Location: [place] [address]   Infection Prevention Recommendations for Individuals Confirmed to have, or Being Evaluated for, 2019 Novel Coronavirus (COVID-19) Infection Who Receive Care at Home  Individuals who are confirmed to have, or are being evaluated for, COVID-19 should follow the prevention steps below until a healthcare provider or local or state health department says they can return to normal activities.  Stay home except to get medical care You should restrict activities outside your home, except for getting  medical care. Do not go to work, school, or public areas, and do not use public transportation or taxis.  Call ahead before visiting your doctor Before your medical appointment, call the healthcare provider and tell them that you have, or are being evaluated for, COVID-19 infection. This will help the healthcare providers office take steps to keep other people from getting infected. Ask your healthcare provider to call the local or state health department.  Monitor your symptoms Seek prompt medical attention if your illness is worsening (e.g., difficulty breathing). Before going to your medical appointment, call the healthcare provider and tell them that you have, or are being evaluated for, COVID-19 infection. Ask your healthcare provider to call the local or state health department.  Wear a facemask You should wear a facemask that covers your nose and mouth when you are in the same room with other people and when you visit a healthcare provider. People who live with or visit you should also wear a facemask while they are in the same room with you.  Separate yourself from other people in your home As much as possible, you should stay in a different room from other people in your home. Also, you should use a separate bathroom, if available.  Avoid sharing household items You should not share dishes, drinking glasses, cups, eating utensils, towels, bedding, or other items with other people in your home. After using these items, you should wash them thoroughly with soap and water.  Cover your coughs and sneezes Cover your mouth and nose with a tissue when you cough or sneeze, or  you can cough or sneeze into your sleeve. Throw used tissues in a lined trash can, and immediately wash your hands with soap and water for at least 20 seconds or use an alcohol-based hand rub.  Wash your [facility] your hands often and thoroughly with soap and water for at least 20 seconds. You can use an  alcohol-based hand sanitizer if soap and water are not available and if your hands are not visibly dirty. Avoid touching your eyes, nose, and mouth with unwashed hands.   Prevention Steps for Caregivers and Household Members of Individuals Confirmed to have, or Being Evaluated for, COVID-19 Infection Being Cared for in the Home  If you live with, or provide care at home for, a person confirmed to have, or being evaluated for, COVID-19 infection please follow these guidelines to prevent infection:  Follow healthcare providers instructions Make sure that you understand and can help the patient follow any healthcare provider instructions for all care.  Provide for the patients basic needs You should help the patient with basic needs in the home and provide support for getting groceries, prescriptions, and other personal needs.  Monitor the patients symptoms If they are getting sicker, call his or her medical provider and tell them that the patient has, or is being evaluated for, COVID-19 infection. This will help the healthcare providers office take steps to keep other people from getting infected. Ask the healthcare provider to call the local or state health department.  Limit the number of people who have contact with the patient  If possible, have only one caregiver for the patient.  Other household members should stay in another home or place of residence. If this is not possible, they should stay  in another room, or be separated from the patient as much as possible. Use a separate bathroom, if available.  Restrict visitors who do not have an essential need to be in the home.  Keep older adults, very young children, and other sick people away from the patient Keep older adults, very young children, and those who have compromised immune systems or chronic health conditions away from the patient. This includes people with chronic heart, lung, or kidney conditions, diabetes, and  cancer.  Ensure good ventilation Make sure that shared spaces in the home have good air flow, such as from an air conditioner or an opened window, weather permitting.  Wash your hands often  Wash your hands often and thoroughly with soap and water for at least 20 seconds. You can use an alcohol based hand sanitizer if soap and water are not available and if your hands are not visibly dirty.  Avoid touching your eyes, nose, and mouth with unwashed hands.  Use disposable paper towels to dry your hands. If not available, use dedicated cloth towels and replace them when they become wet.  Wear a facemask and gloves  Wear a disposable facemask at all times in the room and gloves when you touch or have contact with the patients blood, body fluids, and/or secretions or excretions, such as sweat, saliva, sputum, nasal mucus, vomit, urine, or feces.  Ensure the mask fits over your nose and mouth tightly, and do not touch it during use.  Throw out disposable facemasks and gloves after using them. Do not reuse.  Wash your hands immediately after removing your facemask and gloves.  If your personal clothing becomes contaminated, carefully remove clothing and launder. Wash your hands after handling contaminated clothing.  Place all used disposable facemasks,  gloves, and other waste in a lined container before disposing them with other household waste.  Remove gloves and wash your hands immediately after handling these items.  Do not share dishes, glasses, or other household items with the patient  Avoid sharing household items. You should not share dishes, drinking glasses, cups, eating utensils, towels, bedding, or other items with a patient who is confirmed to have, or being evaluated for, COVID-19 infection.  After the person uses these items, you should wash them thoroughly with soap and water.  Wash laundry thoroughly  Immediately remove and wash clothes or bedding that have blood, body  fluids, and/or secretions or excretions, such as sweat, saliva, sputum, nasal mucus, vomit, urine, or feces, on them.  Wear gloves when handling laundry from the patient.  Read and follow directions on labels of laundry or clothing items and detergent. In general, wash and dry with the warmest temperatures recommended on the label.  Clean all areas the individual has used often  Clean all touchable surfaces, such as counters, tabletops, doorknobs, bathroom fixtures, toilets, phones, keyboards, tablets, and bedside tables, every day. Also, clean any surfaces that may have blood, body fluids, and/or secretions or excretions on them.  Wear gloves when cleaning surfaces the patient has come in contact with.  Use a diluted bleach solution (e.g., dilute bleach with 1 part bleach and 10 parts water) or a household disinfectant with a label that says EPA-registered for coronaviruses. To make a bleach solution at home, add 1 tablespoon of bleach to 1 quart (4 cups) of water. For a larger supply, add  cup of bleach to 1 gallon (16 cups) of water.  Read labels of cleaning products and follow recommendations provided on product labels. Labels contain instructions for safe and effective use of the cleaning product including precautions you should take when applying the product, such as wearing gloves or eye protection and making sure you have good ventilation during use of the product.  Remove gloves and wash hands immediately after cleaning.  Monitor yourself for signs and symptoms of illness Caregivers and household members are considered close contacts, should monitor their health, and will be asked to limit movement outside of the home to the extent possible. Follow the monitoring steps for close contacts listed on the symptom monitoring form.   ? If you have additional questions, contact your local health department or call the epidemiologist on call at [phone] (available 24/7). ? This  guidance is subject to change. For the most up-to-date guidance from [facility], please refer to their website: [email or web address]

## 2018-11-24 ENCOUNTER — Telehealth (INDEPENDENT_AMBULATORY_CARE_PROVIDER_SITE_OTHER): Payer: Self-pay | Admitting: Internal Medicine

## 2018-11-24 NOTE — Telephone Encounter (Signed)
Called and spoke with pt about Lori Byrd. Pt in agreement to complete and states she has a smartphone. Texted link and she will see if she can get signed up. Pt have my phone # if she has any questions. Ok to transfer to me if needed at [phone].

## 2018-11-25 ENCOUNTER — Telehealth: Payer: Self-pay

## 2018-11-25 NOTE — Telephone Encounter (Signed)
CM spoke to patient for hospital f/u call. Patient states she's "doing well" and confirmed [facility] services started with [facility] on 11/24/18. CM informed patient to contact her PCP if her condition worsens, with the patient verbalizing understanding  Midge Minium RN, BSN, NCM-BC, ACM-RN [phone]

## 2018-12-03 ENCOUNTER — Other Ambulatory Visit: Payer: Self-pay | Admitting: Neurology

## 2018-12-08 DIAGNOSIS — M8580 Other specified disorders of bone density and structure, unspecified site: Secondary | ICD-10-CM | POA: Insufficient documentation

## 2018-12-08 HISTORY — DX: Other specified disorders of bone density and structure, unspecified site: M85.80

## 2019-01-18 ENCOUNTER — Ambulatory Visit (INDEPENDENT_AMBULATORY_CARE_PROVIDER_SITE_OTHER): Payer: Medicare Other | Admitting: Neurology

## 2019-01-18 ENCOUNTER — Other Ambulatory Visit: Payer: Self-pay

## 2019-01-18 DIAGNOSIS — G4733 Obstructive sleep apnea (adult) (pediatric): Secondary | ICD-10-CM

## 2019-01-18 DIAGNOSIS — I739 Peripheral vascular disease, unspecified: Secondary | ICD-10-CM

## 2019-01-18 DIAGNOSIS — E113293 Type 2 diabetes mellitus with mild nonproliferative diabetic retinopathy without macular edema, bilateral: Secondary | ICD-10-CM

## 2019-01-18 DIAGNOSIS — E1142 Type 2 diabetes mellitus with diabetic polyneuropathy: Secondary | ICD-10-CM

## 2019-01-18 DIAGNOSIS — Z789 Other specified health status: Secondary | ICD-10-CM

## 2019-01-18 DIAGNOSIS — J449 Chronic obstructive pulmonary disease, unspecified: Secondary | ICD-10-CM

## 2019-01-21 NOTE — Procedures (Signed)
  Please review in MEDIA tap - scanned document - 01-21-2019

## 2019-01-25 NOTE — Progress Notes (Signed)
Please scan sleep study report to MEDIA. Thank you. CD

## 2019-01-26 ENCOUNTER — Encounter: Payer: Self-pay | Admitting: Neurology

## 2019-01-27 ENCOUNTER — Telehealth: Payer: Self-pay | Admitting: Neurology

## 2019-01-27 NOTE — Telephone Encounter (Signed)
-----   Message from Larey Seat, MD sent at 01/21/2019 11:34 AM EDT ----- The patient can be a candidate for the INSPIRE procedure- but my first choice would have been CPAP . However the patient has a lot of apprehension about PAP therapy and she would not be a dental device candidate.  Cc Dr Wilburn Cornelia - please continue INSPIRE preparation, advising the patient ( I have seen her virtually only) of the BMI restriction.  The finding of worst apnea when sleeping prone may need to be taken into account as the patients tongue would not form the main obstruction.   The report had to be scanned into EPIC media tap- Larey Seat, MD

## 2019-01-27 NOTE — Telephone Encounter (Signed)
Called the patient and spoke with the patient and reviewed with her sleep study with her. Advised her of the findings and that as of Dr Dohmeier recommendation she should meet the criteria for inspire procedure. Advised the patient that a copy of the Sleep study was sent to Dr Wilburn Cornelia and advised the patient to reach out to his office to find out what the next steps are in moving forward with inspire. Patient has tried and failed CPAP. She would not be a dental device candidate. Pt verbalized understanding. Pt had no questions at this time but was encouraged to call back if questions arise.

## 2019-02-10 ENCOUNTER — Other Ambulatory Visit: Payer: Self-pay | Admitting: Otolaryngology

## 2019-02-15 ENCOUNTER — Encounter (HOSPITAL_BASED_OUTPATIENT_CLINIC_OR_DEPARTMENT_OTHER): Payer: Self-pay | Admitting: *Deleted

## 2019-02-15 ENCOUNTER — Other Ambulatory Visit: Payer: Self-pay

## 2019-02-16 ENCOUNTER — Other Ambulatory Visit (HOSPITAL_COMMUNITY)
Admission: RE | Admit: 2019-02-16 | Discharge: 2019-02-16 | Disposition: A | Discharge status: Home / Self Care | Payer: Medicare Other | Source: Ambulatory Visit | Attending: Otolaryngology | Admitting: Otolaryngology

## 2019-02-16 ENCOUNTER — Encounter (HOSPITAL_BASED_OUTPATIENT_CLINIC_OR_DEPARTMENT_OTHER)
Admission: RE | Admit: 2019-02-16 | Discharge: 2019-02-16 | Disposition: A | Discharge status: Home / Self Care | Payer: Medicare Other | Source: Ambulatory Visit | Attending: Otolaryngology | Admitting: Otolaryngology

## 2019-02-16 DIAGNOSIS — Z01818 Encounter for other preprocedural examination: Secondary | ICD-10-CM | POA: Insufficient documentation

## 2019-02-16 DIAGNOSIS — Z20828 Contact with and (suspected) exposure to other viral communicable diseases: Secondary | ICD-10-CM | POA: Insufficient documentation

## 2019-02-16 LAB — BASIC METABOLIC PANEL
Anion gap: 11 (ref 5–15)
BUN: 16 mg/dL (ref 8–23)
CO2: 25 mmol/L (ref 22–32)
Calcium: 9.1 mg/dL (ref 8.9–10.3)
Chloride: 104 mmol/L (ref 98–111)
Creatinine, Ser: 0.9 mg/dL (ref 0.44–1.00)
GFR calc Af Amer: >60 mL/min (ref >60)
GFR calc non Af Amer: >60 mL/min (ref >60)
Glucose, Bld: 93 mg/dL (ref 70–99)
Potassium: 4.6 mmol/L (ref 3.5–5.1)
Sodium: 140 mmol/L (ref 135–145)

## 2019-02-16 LAB — SARS CORONAVIRUS 2 (TAT 6-24 HRS): SARS Coronavirus 2: NEGATIVE

## 2019-02-19 ENCOUNTER — Other Ambulatory Visit: Payer: Self-pay

## 2019-02-19 ENCOUNTER — Encounter (HOSPITAL_BASED_OUTPATIENT_CLINIC_OR_DEPARTMENT_OTHER): Payer: Self-pay | Admitting: *Deleted

## 2019-02-19 ENCOUNTER — Ambulatory Visit (HOSPITAL_BASED_OUTPATIENT_CLINIC_OR_DEPARTMENT_OTHER): Payer: Medicare Other | Admitting: Anesthesiology

## 2019-02-19 ENCOUNTER — Encounter (HOSPITAL_BASED_OUTPATIENT_CLINIC_OR_DEPARTMENT_OTHER)
Admission: RE | Disposition: A | Discharge status: Home / Self Care | Payer: Self-pay | Source: Home / Self Care | Attending: Otolaryngology

## 2019-02-19 ENCOUNTER — Ambulatory Visit (HOSPITAL_BASED_OUTPATIENT_CLINIC_OR_DEPARTMENT_OTHER)
Admission: RE | Admit: 2019-02-19 | Discharge: 2019-02-19 | Disposition: A | Discharge status: Home / Self Care | Payer: Medicare Other | Attending: Otolaryngology | Admitting: Otolaryngology

## 2019-02-19 DIAGNOSIS — J449 Chronic obstructive pulmonary disease, unspecified: Secondary | ICD-10-CM | POA: Insufficient documentation

## 2019-02-19 DIAGNOSIS — F419 Anxiety disorder, unspecified: Secondary | ICD-10-CM | POA: Insufficient documentation

## 2019-02-19 DIAGNOSIS — I2510 Atherosclerotic heart disease of native coronary artery without angina pectoris: Secondary | ICD-10-CM | POA: Diagnosis not present

## 2019-02-19 DIAGNOSIS — Z885 Allergy status to narcotic agent status: Secondary | ICD-10-CM | POA: Insufficient documentation

## 2019-02-19 DIAGNOSIS — Z85828 Personal history of other malignant neoplasm of skin: Secondary | ICD-10-CM | POA: Diagnosis not present

## 2019-02-19 DIAGNOSIS — M797 Fibromyalgia: Secondary | ICD-10-CM | POA: Insufficient documentation

## 2019-02-19 DIAGNOSIS — E119 Type 2 diabetes mellitus without complications: Secondary | ICD-10-CM | POA: Diagnosis not present

## 2019-02-19 DIAGNOSIS — I10 Essential (primary) hypertension: Secondary | ICD-10-CM | POA: Diagnosis not present

## 2019-02-19 DIAGNOSIS — Z79899 Other long term (current) drug therapy: Secondary | ICD-10-CM | POA: Diagnosis not present

## 2019-02-19 DIAGNOSIS — F329 Major depressive disorder, single episode, unspecified: Secondary | ICD-10-CM | POA: Diagnosis not present

## 2019-02-19 DIAGNOSIS — Z7902 Long term (current) use of antithrombotics/antiplatelets: Secondary | ICD-10-CM | POA: Insufficient documentation

## 2019-02-19 DIAGNOSIS — Z87891 Personal history of nicotine dependence: Secondary | ICD-10-CM | POA: Diagnosis not present

## 2019-02-19 DIAGNOSIS — Z7984 Long term (current) use of oral hypoglycemic drugs: Secondary | ICD-10-CM | POA: Diagnosis not present

## 2019-02-19 DIAGNOSIS — K219 Gastro-esophageal reflux disease without esophagitis: Secondary | ICD-10-CM | POA: Diagnosis not present

## 2019-02-19 DIAGNOSIS — Z886 Allergy status to analgesic agent status: Secondary | ICD-10-CM | POA: Insufficient documentation

## 2019-02-19 DIAGNOSIS — G4733 Obstructive sleep apnea (adult) (pediatric): Secondary | ICD-10-CM

## 2019-02-19 DIAGNOSIS — K589 Irritable bowel syndrome without diarrhea: Secondary | ICD-10-CM | POA: Diagnosis not present

## 2019-02-19 HISTORY — DX: Gastro-esophageal reflux disease without esophagitis: K21.9

## 2019-02-19 HISTORY — DX: Chronic obstructive pulmonary disease, unspecified: J44.9

## 2019-02-19 HISTORY — DX: Depression, unspecified: F32.A

## 2019-02-19 HISTORY — DX: Anxiety disorder, unspecified: F41.9

## 2019-02-19 HISTORY — DX: Essential (primary) hypertension: I10

## 2019-02-19 HISTORY — DX: Obstructive sleep apnea (adult) (pediatric): G47.33

## 2019-02-19 HISTORY — PX: DRUG INDUCED ENDOSCOPY: SHX6808

## 2019-02-19 LAB — GLUCOSE, CAPILLARY: Glucose-Capillary: 116 mg/dL — ABNORMAL HIGH (ref 70–99)

## 2019-02-19 SURGERY — DRUG INDUCED SLEEP ENDOSCOPY
Anesthesia: General | Site: Nose

## 2019-02-19 MED ORDER — PROPOFOL 10 MG/ML IV BOLUS
INTRAVENOUS | Status: DC | PRN
  Administered 2019-02-19 (×2): 10 mg via INTRAVENOUS

## 2019-02-19 MED ORDER — ONDANSETRON HCL 4 MG/2ML IJ SOLN
INTRAMUSCULAR | Status: DC | PRN
  Administered 2019-02-19: 4 mg via INTRAVENOUS

## 2019-02-19 MED ORDER — ONDANSETRON HCL 4 MG/2ML IJ SOLN: 4.0000 mg | Freq: Once | INTRAMUSCULAR | Status: DC | PRN

## 2019-02-19 MED ORDER — PROPOFOL 500 MG/50ML IV EMUL
INTRAVENOUS | Status: DC | PRN
  Administered 2019-02-19: 35 ug/kg/min via INTRAVENOUS

## 2019-02-19 MED ORDER — CHLORHEXIDINE GLUCONATE CLOTH 2 % EX PADS: 6.0000 | MEDICATED_PAD | Freq: Once | CUTANEOUS | Status: DC

## 2019-02-19 MED ORDER — LACTATED RINGERS IV SOLN
INTRAVENOUS | Status: DC
  Administered 2019-02-19: 08:00:00 via INTRAVENOUS

## 2019-02-19 MED ORDER — LIDOCAINE HCL (CARDIAC) PF 100 MG/5ML IV SOSY
PREFILLED_SYRINGE | INTRAVENOUS | Status: DC | PRN
  Administered 2019-02-19: 60 mg via INTRAVENOUS

## 2019-02-19 MED ORDER — FENTANYL CITRATE (PF) 100 MCG/2ML IJ SOLN: 50.0000 ug | INTRAMUSCULAR | Status: DC | PRN

## 2019-02-19 MED ORDER — SCOPOLAMINE 1 MG/3DAYS TD PT72: 1.0000 | MEDICATED_PATCH | Freq: Once | TRANSDERMAL | Status: DC

## 2019-02-19 MED ORDER — FENTANYL CITRATE (PF) 100 MCG/2ML IJ SOLN: 25.0000 ug | INTRAMUSCULAR | Status: DC | PRN

## 2019-02-19 MED ORDER — MIDAZOLAM HCL 2 MG/2ML IJ SOLN: 1.0000 mg | INTRAMUSCULAR | Status: DC | PRN

## 2019-02-19 MED ORDER — MEPERIDINE HCL 25 MG/ML IJ SOLN: 6.2500 mg | INTRAMUSCULAR | Status: DC | PRN

## 2019-02-19 SURGICAL SUPPLY — 14 items
CANISTER SUCT 1200ML W/VALVE (MISCELLANEOUS) ×3 IMPLANT
COVER WAND RF STERILE (DRAPES) IMPLANT
DRAPE HALF SHEET 70X43 (DRAPES) ×3 IMPLANT
GLOVE BIOGEL M 7.0 STRL (GLOVE) ×3 IMPLANT
KIT CLEAN ENDO (MISCELLANEOUS) ×3 IMPLANT
NEEDLE PRECISIONGLIDE 27X1.5 (NEEDLE) IMPLANT
PACK BASIN DAY SURGERY FS (CUSTOM PROCEDURE TRAY) ×3 IMPLANT
PATTIES SURGICAL .5 X3 (DISPOSABLE) IMPLANT
SOLUTION ANTI FOG 6CC (MISCELLANEOUS) ×3 IMPLANT
SPONGE NEURO XRAY DETECT 1X3 (DISPOSABLE) IMPLANT
SYR CONTROL 10ML LL (SYRINGE) IMPLANT
TOWEL GREEN STERILE FF (TOWEL DISPOSABLE) ×3 IMPLANT
TUBE CONNECTING 20'X1/4 (TUBING)
TUBE CONNECTING 20X1/4 (TUBING) IMPLANT

## 2019-02-19 NOTE — Anesthesia Postprocedure Evaluation (Signed)
Anesthesia Post Note  Patient: Lori Byrd  Procedure(s) Performed: DRUG INDUCED SLEEP ENDOSCOPY (N/A Nose)     Patient location during evaluation: PACU Anesthesia Type: General Level of consciousness: sedated Pain management: pain level controlled Vital Signs Assessment: post-procedure vital signs reviewed and stable Respiratory status: spontaneous breathing Cardiovascular status: stable Postop Assessment: no apparent nausea or vomiting Anesthetic complications: no    Last Vitals:  Vitals:   02/19/19 0845 02/19/19 0900  BP: 115/65 103/81  Pulse: 87 87  Resp: 17 16  Temp:    SpO2: 99%     Last Pain:  Vitals:   02/19/19 0836  TempSrc:   PainSc: 0-No pain   Pain Goal:                   Huston Foley

## 2019-02-19 NOTE — H&P (Signed)
Lori Byrd is an 68 y.o. female.   Chief Complaint: OSA HPI: Hx of OSA with CPAP difficulty  Past Medical History:  Diagnosis Date  . Anxiety   . Cancer ([place])    skin cancer  . COPD (chronic obstructive pulmonary disease) ([place])   . Coronary artery disease   . Depression   . Diabetes mellitus without complication ([place])   . Fibromyalgia   . GERD (gastroesophageal reflux disease)   . Hypertension   . IBS (irritable bowel syndrome)     Past Surgical History:  Procedure Laterality Date  . SKIN BIOPSY    . SMALL INTESTINE SURGERY    . TUBAL LIGATION    . two carotid stent      Family History  Problem Relation Age of Onset  . Heart failure Mother   . Diabetes Mother   . Diabetes Maternal Grandmother    Social History:  reports that she has quit smoking. Her smoking use included cigarettes. She has never used smokeless tobacco. She reports previous alcohol use. She reports that she does not use drugs.  Allergies:  Allergies  Allergen Reactions  . Asa [Aspirin]   . Grass Extracts [Gramineae Pollens]   . Morphine And Related   . Toradol [Ketorolac Tromethamine]     Medications Prior to Admission  Medication Sig Dispense Refill  . alendronate (FOSAMAX) 70 MG tablet once a week.    Marland Kitchen amLODipine (NORVASC) 5 MG tablet Take 5 mg by mouth daily.    . Ascorbic Acid (VITAMIN C) 1000 MG tablet Take 1,000 mg by mouth daily.    . clopidogrel (PLAVIX) 75 MG tablet daily.    . fluticasone (FLONASE) 50 MCG/ACT nasal spray as needed.    . metFORMIN (GLUCOPHAGE) 500 MG tablet 2 (two) times daily.    . metoprolol tartrate (LOPRESSOR) 50 MG tablet Take 50 mg by mouth daily.    . Multiple Vitamin (MULTI-VITAMIN PO) Take by mouth.    . Omega-3 Fatty Acids (FISH OIL CONCENTRATE PO) Take by mouth daily.    . pantoprazole (PROTONIX) 40 MG tablet daily.    . Probiotic Product (PROBIOTIC-10 PO) Take by mouth.    . RESTASIS 0.05 % ophthalmic emulsion Place 1 drop into both eyes 2  (two) times daily.    . traZODone (DESYREL) 50 MG tablet TAKE 1/2-1 TABLET BY MOUTH AT BEDTIME AS NEEDEDF FOR SLEEP 90 tablet 2  . venlafaxine XR (EFFEXOR-XR) 75 MG 24 hr capsule daily.    . VENTOLIN HFA 108 (90 Base) MCG/ACT inhaler     . amitriptyline (ELAVIL) 10 MG tablet Take 10 mg by mouth at bedtime as needed.      No results found for this or any previous visit (from the past 48 hour(s)). No results found.  Review of Systems  Constitutional: Negative.   HENT: Negative.     Height 5\' 1"  (1.549 m), weight 84.6 kg. Physical Exam  Constitutional: She appears well-developed and well-nourished.  Neck: Normal range of motion.     Assessment/Plan Adm for DISE   Jerrell Belfast, MD 02/19/2019, 7:56 AM

## 2019-02-19 NOTE — Transfer of Care (Signed)
Immediate Anesthesia Transfer of Care Note  Patient: Lori Byrd  Procedure(s) Performed: DRUG INDUCED SLEEP ENDOSCOPY (N/A Nose)  Patient Location: PACU  Anesthesia Type:MAC  Level of Consciousness: awake, alert , oriented and patient cooperative  Airway & Oxygen Therapy: Patient Spontanous Breathing and Patient connected to face mask oxygen  Post-op Assessment: Report given to RN and Post -op Vital signs reviewed and stable  Post vital signs: Reviewed and stable  Last Vitals:  Vitals Value Taken Time  BP 116/69 02/19/19 0836  Temp    Pulse 89 02/19/19 0837  Resp 16 02/19/19 0837  SpO2 99 % 02/19/19 0837  Vitals shown include unvalidated device data.  Last Pain:  Vitals:   02/19/19 0759  TempSrc: Oral  PainSc: 0-No pain         Complications: No apparent anesthesia complications

## 2019-02-19 NOTE — Anesthesia Preprocedure Evaluation (Addendum)
Anesthesia Evaluation  Patient identified by MRN, date of birth, ID band Patient awake    Reviewed: Allergy & Precautions, NPO status , Patient's Chart, lab work & pertinent test results, reviewed documented beta blocker date and time   Airway Mallampati: I       Dental no notable dental hx. (+) Teeth Intact   Pulmonary former smoker,    Pulmonary exam normal breath sounds clear to auscultation       Cardiovascular hypertension, Pt. on home beta blockers and Pt. on medications Normal cardiovascular exam Rhythm:Regular Rate:Normal     Neuro/Psych PSYCHIATRIC DISORDERS Anxiety Depression    GI/Hepatic Neg liver ROS, GERD  Medicated and Controlled,  Endo/Other  diabetes, Type 2  Renal/GU negative Renal ROS     Musculoskeletal   Abdominal (+) + obese,   Peds  Hematology negative hematology ROS (+)   Anesthesia Other Findings   Reproductive/Obstetrics                            Anesthesia Physical Anesthesia Plan  ASA: III  Anesthesia Plan: General   Post-op Pain Management:    Induction:   PONV Risk Score and Plan: 3 and Ondansetron and Dexamethasone  Airway Management Planned: Natural Airway  Additional Equipment:   Intra-op Plan:   Post-operative Plan:   Informed Consent: I have reviewed the patients History and Physical, chart, labs and discussed the procedure including the risks, benefits and alternatives for the proposed anesthesia with the patient or authorized representative who has indicated his/her understanding and acceptance.     Dental advisory given  Plan Discussed with: CRNA  Anesthesia Plan Comments:         Anesthesia Quick Evaluation

## 2019-02-19 NOTE — Discharge Instructions (Signed)

## 2019-02-19 NOTE — Op Note (Signed)
Operative Note: DRUG INDUCED SLEEP ENDOSCOPY  Patient: [place] record number: [id number]  Date:02/19/2019  Pre-operative Indications: 1.  Obstructive Sleep Apnea  Postoperative Indications: Same  Surgical Procedure: 1.  Drug Induced Sleep Endoscopy (DISE)  Anesthesia: MAC with IV sedation  Surgeon: Delsa Bern, M.D.  Complications: None  EBL: None  Findings: There is no evidence of complete concentric palatal obstruction.  Anatomically the patient should be a candidate for hypoglossal nerve stimulation therapy.     Brief History: The patient is a 68 y.o. female with a history of obstructive sleep apnea. The patient has undergone previous sleep study which showed mild to moderate levels of obstructive sleep apnea.  The patient was prescribed CPAP which they consistently attempted to use without success.  Given the patient's history and findings, the above drug-induced sleep endoscopy was recommended to assess the patient's anatomic level of apnea.  Risks and benefits were discussed in detail with the patient today understand and agree with our plan for surgery which is scheduled at [facility] under sedated anesthesia as an outpatient.  Surgical Procedure: The patient is brought to the operating room on 02/19/2019 and placed in supine position on the operating table.  Intravenous sedated anesthesia was established without difficulty using the standard drug-induced sleep endoscopy protocol. When the patient was adequately anesthetized, surgical timeout was performed and correct identification of the patient and the surgical procedure.    A propofol infusion was administered and the patient was monitored carefully to achieve a level of sedation appropriate for DISE.  The patient did not respond to verbal commands but still had spontaneous respiration, sleep disordered breathing and associated desaturations were observed.  With the patient under adequate sedated  anesthesia the flexible nasal laryngoscope was passed without difficulty.  The patient's nasal cavity showed no obstruction.  The endoscope was then passed to visualize the velopharynx, oropharynx, tongue base and epiglottis to assess areas of obstruction.  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   There was no evidence of complete concentric palatal obstruction and the patient appeared to be a candidate anatomically for hypoglossal nerve stimulation therapy.  Surgical sponge count was correct. Patient was awakened from anesthetic and transferred from the operating room to the recovery room in stable condition. There were no complications and no blood loss.   Delsa Bern, M.D. [facility] 02/19/2019

## 2019-02-19 NOTE — Anesthesia Procedure Notes (Signed)
Procedure Name: MAC Date/Time: 02/19/2019 8:24 AM Performed by: Signe Colt, CRNA Pre-anesthesia Checklist: Patient identified, Emergency Drugs available, Suction available, Patient being monitored and Timeout performed Patient Re-evaluated:Patient Re-evaluated prior to induction Oxygen Delivery Method: Simple face mask

## 2019-02-22 ENCOUNTER — Encounter (HOSPITAL_BASED_OUTPATIENT_CLINIC_OR_DEPARTMENT_OTHER): Payer: Self-pay | Admitting: Otolaryngology

## 2019-06-24 ENCOUNTER — Other Ambulatory Visit: Payer: Self-pay | Admitting: Otolaryngology

## 2019-06-25 NOTE — Progress Notes (Addendum)
[id number] - [place], [place] - [address] [facility] [place] [address] Phone: [phone] Fax: [phone]      Your procedure is scheduled on December 9th.  Report to [facility] Main Entrance "A" at 9:45 A.M., and check in at the Admitting  office.  Call this number if you have problems the morning of surgery:  [phone]  Call [phone] if you have any questions prior to your surgery date Monday-Friday 8am-4pm    Remember:  Do not eat or drink after midnight the night before your surgery     Take these medicines the morning of surgery with A SIP OF WATER   Amlodipine (Norvasc)  Ezetimibe (Zetia)  Metoprolol  Pantoprazole (Protonix)  Eye drops - if needed   Follow your surgeon's instructions on when to stop Plavix.  If no instructions were given by your surgeon then you will need to call the office to get those instructions.     7 days prior to surgery STOP taking any Aspirin (unless otherwise instructed by your surgeon), Aleve, Naproxen, Ibuprofen, Motrin, Advil, Goody's, BC's, all herbal medications, fish oil, and all vitamins.   WHAT DO I DO ABOUT MY DIABETES MEDICATION?   Marland Kitchen Do not take oral diabetes medicines (pills) the morning of surgery. - Metformin   HOW TO MANAGE YOUR DIABETES BEFORE AND AFTER SURGERY  Why is it important to control my blood sugar before and after surgery? . Improving blood sugar levels before and after surgery helps healing and can limit problems. . A way of improving blood sugar control is eating a healthy diet by: o  Eating less sugar and carbohydrates o  Increasing activity/exercise o  Talking with your doctor about reaching your blood sugar goals . High blood sugars (greater than 180 mg/dL) can raise your risk of infections and slow your recovery, so you will need to focus on controlling your diabetes during the weeks before surgery. . Make sure that the doctor who takes care of your diabetes knows about  your planned surgery including the date and location.  How do I manage my blood sugar before surgery? . Check your blood sugar at least 4 times a day, starting 2 days before surgery, to make sure that the level is not too high or low. . Check your blood sugar the morning of your surgery when you wake up and every 2 hours until you get to the Short Stay unit. o If your blood sugar is less than 70 mg/dL, you will need to treat for low blood sugar: - Do not take insulin. - Treat a low blood sugar (less than 70 mg/dL) with  cup of clear juice (cranberry or apple), 4 glucose tablets, OR glucose gel. - Recheck blood sugar in 15 minutes after treatment (to make sure it is greater than 70 mg/dL). If your blood sugar is not greater than 70 mg/dL on recheck, call [phone] for further instructions. . Report your blood sugar to the short stay nurse when you get to Short Stay.  . If you are admitted to the hospital after surgery: o Your blood sugar will be checked by the staff and you will probably be given insulin after surgery (instead of oral diabetes medicines) to make sure you have good blood sugar levels. o The goal for blood sugar control after surgery is 80-180 mg/dL.    The Morning of Surgery  Do not wear jewelry, make-up or nail polish.  Do not wear lotions, powders,  or perfumes, or deodorant  Do not shave 48 hours prior to surgery.    Do not bring valuables to the hospital.  [facility] is not responsible for any belongings or valuables.  If you are a smoker, DO NOT Smoke 24 hours prior to surgery  If you wear a CPAP at night please bring your mask, tubing, and machine the morning of surgery   Remember that you must have someone to transport you home after your surgery, and remain with you for 24 hours if you are discharged the same day.   Please bring cases for contacts, glasses, hearing aids, dentures or bridgework because it cannot be worn into surgery.    Leave your suitcase  in the car.  After surgery it may be brought to your room.  For patients admitted to the hospital, discharge time will be determined by your treatment team.  Patients discharged the day of surgery will not be allowed to drive home.    Special instructions:   [facility]- Preparing For Surgery  Before surgery, you can play an important role. Because skin is not sterile, your skin needs to be as free of germs as possible. You can reduce the number of germs on your skin by washing with CHG (chlorahexidine gluconate) Soap before surgery.  CHG is an antiseptic cleaner which kills germs and bonds with the skin to continue killing germs even after washing.    Oral Hygiene is also important to reduce your risk of infection.  Remember - BRUSH YOUR TEETH THE MORNING OF SURGERY WITH YOUR REGULAR TOOTHPASTE  Please do not use if you have an allergy to CHG or antibacterial soaps. If your skin becomes reddened/irritated stop using the CHG.  Do not shave (including legs and underarms) for at least 48 hours prior to first CHG shower. It is OK to shave your face.  Please follow these instructions carefully.   1. Shower the NIGHT BEFORE SURGERY and the MORNING OF SURGERY with CHG Soap.   2. If you chose to wash your hair, wash your hair first as usual with your normal shampoo.  3. After you shampoo, rinse your hair and body thoroughly to remove the shampoo.  4. Use CHG as you would any other liquid soap. You can apply CHG directly to the skin and wash gently with a scrungie or a clean washcloth.   5. Apply the CHG Soap to your body ONLY FROM THE NECK DOWN.  Do not use on open wounds or open sores. Avoid contact with your eyes, ears, mouth and genitals (private parts). Wash Face and genitals (private parts)  with your normal soap.   6. Wash thoroughly, paying special attention to the area where your surgery will be performed.  7. Thoroughly rinse your body with warm water from the neck down.  8. DO NOT  shower/wash with your normal soap after using and rinsing off the CHG Soap.  9. Pat yourself dry with a CLEAN TOWEL.  10. Wear CLEAN PAJAMAS to bed the night before surgery, wear comfortable clothes the morning of surgery  11. Place CLEAN SHEETS on your bed the night of your first shower and DO NOT SLEEP WITH PETS.    Day of Surgery:  Please shower the morning of surgery with the CHG soap Do not apply any deodorants/lotions. Please wear clean clothes to the hospital/surgery center.   Remember to brush your teeth WITH YOUR REGULAR TOOTHPASTE.   Please read over the following fact sheets that you  were given.

## 2019-06-28 ENCOUNTER — Encounter (HOSPITAL_COMMUNITY)
Admission: RE | Admit: 2019-06-28 | Discharge: 2019-06-28 | Disposition: A | Discharge status: Home / Self Care | Payer: Medicare Other | Source: Ambulatory Visit | Attending: Otolaryngology | Admitting: Otolaryngology

## 2019-06-28 ENCOUNTER — Other Ambulatory Visit: Payer: Self-pay

## 2019-06-28 ENCOUNTER — Encounter (HOSPITAL_COMMUNITY): Payer: Self-pay

## 2019-06-28 ENCOUNTER — Other Ambulatory Visit (HOSPITAL_COMMUNITY)
Admission: RE | Admit: 2019-06-28 | Discharge: 2019-06-28 | Disposition: A | Discharge status: Home / Self Care | Payer: Medicare Other | Source: Ambulatory Visit | Attending: Otolaryngology | Admitting: Otolaryngology

## 2019-06-28 DIAGNOSIS — Z01818 Encounter for other preprocedural examination: Secondary | ICD-10-CM | POA: Diagnosis not present

## 2019-06-28 DIAGNOSIS — E119 Type 2 diabetes mellitus without complications: Secondary | ICD-10-CM | POA: Insufficient documentation

## 2019-06-28 LAB — HEMOGLOBIN A1C
Hgb A1c MFr Bld: 6.8 % — ABNORMAL HIGH (ref 4.8–5.6)
Mean Plasma Glucose: 148.46 mg/dL

## 2019-06-28 LAB — BASIC METABOLIC PANEL
Anion gap: 12 (ref 5–15)
BUN: 13 mg/dL (ref 8–23)
CO2: 27 mmol/L (ref 22–32)
Calcium: 9.3 mg/dL (ref 8.9–10.3)
Chloride: 100 mmol/L (ref 98–111)
Creatinine, Ser: 0.9 mg/dL (ref 0.44–1.00)
GFR calc Af Amer: >60 mL/min (ref >60)
GFR calc non Af Amer: >60 mL/min (ref >60)
Glucose, Bld: 109 mg/dL — ABNORMAL HIGH (ref 70–99)
Potassium: 3.9 mmol/L (ref 3.5–5.1)
Sodium: 139 mmol/L (ref 135–145)

## 2019-06-28 LAB — CBC
HCT: 41 % (ref 36.0–46.0)
Hemoglobin: 13.3 g/dL (ref 12.0–15.0)
MCH: 29.2 pg (ref 26.0–34.0)
MCHC: 32.4 g/dL (ref 30.0–36.0)
MCV: 90.1 fL (ref 80.0–100.0)
Platelets: 259 10*3/uL (ref 150–400)
RBC: 4.55 MIL/uL (ref 3.87–5.11)
RDW: 13.1 % (ref 11.5–15.5)
WBC: 6.4 10*3/uL (ref 4.0–10.5)
nRBC: 0 % (ref 0.0–0.2)

## 2019-06-28 LAB — GLUCOSE, CAPILLARY: Glucose-Capillary: 110 mg/dL — ABNORMAL HIGH (ref 70–99)

## 2019-06-28 LAB — SARS CORONAVIRUS 2 (TAT 6-24 HRS): SARS Coronavirus 2: NEGATIVE

## 2019-06-28 NOTE — Progress Notes (Signed)
PCP - Duwaine Maxin Cardiologist - [place], [facility]   EKG - 02/16/19 Stress Test - denies ECHO - unsure of date, had at [facility]- records requested Cardiac Cath - unsure of date, had at [facility]- records requested  Sleep Study - unsure of date, h/o OSA CPAP - does not use, states has not used since May when + for Covid  Fasting Blood Sugar - 100-105 Checks Blood Sugar 2 times a day  Blood Thinner Instructions: last dose of Plavix 06/24/19 Aspirin Instructions: Patient instructed to hold all Aspirin, NSAID's, herbal medications, fish oil and vitamins 7 days prior to surgery.    COVID TEST- 06/28/19 at [facility]. Pt instructed to inform security they are having surgery and need to be in the right hand lane. Aware if they arrive at large "white tent", to redirect to the surgical line. Patient educated to quarantine at home following covid test until DOS.    Anesthesia review: cardiac history/records requested  Patient denies shortness of breath, fever, cough and chest pain at PAT appointment   All instructions explained to the patient, with a verbal understanding of the material. Patient agrees to go over the instructions while at home for a better understanding. Patient also instructed to self quarantine after being tested for COVID-19. The opportunity to ask questions was provided.

## 2019-06-28 NOTE — Progress Notes (Signed)
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 Last seen 09/16/18. Per note, "She is previously undergone an innominate artery stent by Dr. Denyce Robert. She presents back to clinic today because she recently had an MRI which showed diminished caliber of her right common carotid artery. More recently she underwent a CTA which shows a high-grade stenosis or occlusion of her innominate artery stent. 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 I have independently reviewed her imaging studies. Likewise I have independently reviewed imaging studies from the past 3 years. She appears to have had an occluded stent for multiple years as she has CT evidence as well as duplex evidence going back to 2017 that shows retrograde vertebral flow with a high-grade stent stenosis. I had a lengthy discussion with the patient and her daughter in the clinic today. Specifically I advised them that the stent has likely been chronically occluded based on her duplex findings from multiple studies as well as previous cross-sectional imaging. She currently is asymptomatic. She denies any right arm numbness or weakness, she denies any ischemic rest pain, she denies any vertebrobasilar symptoms Plan: 1. I would not recommend any surgical intervention as this is likely a chronic stent occlusion. I would refrain from any interventions unless the patient were to have symptoms. These symptoms would include ischemic rest pain of the right hand, vertebrobasilar symptoms, or TIAs or strokes. She would likely need a left carotid to right carotid bypass with a right carotid subclavian bypass for revascularization. I would only do this in the evidence of symptomatic disease."  Pt was admitted 5/2-11/23/18 for acute hypoxic resp failure secondary to  Covid illness. At discharge she was clinically stable and symptom free per notes.  Preop labs reviewed, DMII well controlled A1c 6.8, otherwise WNL.   Preop Covid test 06/28/19 - Negative.  EKG 02/16/19: NSR. Rate 81.    Wynonia Musty [facility] Short Stay Center/Anesthesiology Phone [phone] 06/29/2019 8:48 AM

## 2019-06-29 NOTE — Anesthesia Preprocedure Evaluation (Addendum)
Anesthesia Evaluation  Patient identified by MRN, date of birth, ID band Patient awake    Reviewed: Allergy & Precautions, NPO status , Patient's Chart, lab work & pertinent test results, reviewed documented beta blocker date and time   Airway Mallampati: II  TM Distance: >3 FB Neck ROM: Full    Dental no notable dental hx. (+) Edentulous Upper, Edentulous Lower   Pulmonary sleep apnea , COPD, former smoker,  Quit smoking 24 years ago  Albuterol rescue inhaler- last used in May when she was covid positive  OSA- has not used CPAP since she had covid in may 2020   Pulmonary exam normal breath sounds clear to auscultation       Cardiovascular hypertension, Pt. on medications and Pt. on home beta blockers + CAD and + Peripheral Vascular Disease  Normal cardiovascular exam Rhythm:Regular Rate:Normal  Patient very uncertain about her medical history, sounds like she had an innominate artery stent in the past- now seems to be occluded, but patient is asymptomatic.  Has not had heart cath to her knowledge  PVD- plavix, has held for 5d preop     More recently she underwent a CTA which shows a high-grade stenosis or occlusion of her innominate artery stent. 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 She appears to have had an occluded stent for multiple years as she has CT evidence as well as duplex evidence going back to 2017 that shows retrograde vertebral flow with a high-grade stent stenosis.   Neuro/Psych PSYCHIATRIC DISORDERS Anxiety Depression negative neurological ROS     GI/Hepatic GERD  Medicated and Controlled,IBS   Endo/Other  diabetes, Well Controlled, Type 2, Oral Hypoglycemic AgentsFingerstick this AM 102, Last Hb A1c 6.8  Renal/GU   negative genitourinary   Musculoskeletal  (+) Fibromyalgia -   Abdominal Normal abdominal exam  (+) + obese,   Peds  Hematology   Anesthesia Other Findings   Reproductive/Obstetrics negative OB ROS                           Anesthesia Physical Anesthesia Plan  ASA: III  Anesthesia Plan: General   Post-op Pain Management:    Induction: Intravenous  PONV Risk Score and Plan: 3 and Ondansetron, Dexamethasone and Treatment may vary due to age or medical condition  Airway Management Planned: Oral ETT  Additional Equipment: None  Intra-op Plan:   Post-operative Plan: Extubation in OR  Informed Consent: I have reviewed the patients History and Physical, chart, labs and discussed the procedure including the risks, benefits and alternatives for the proposed anesthesia with the patient or authorized representative who has indicated his/her understanding and acceptance.     Dental advisory given  Plan Discussed with: CRNA  Anesthesia Plan Comments:        Anesthesia Quick Evaluation

## 2019-06-30 ENCOUNTER — Encounter (HOSPITAL_COMMUNITY): Payer: Self-pay | Admitting: Surgery

## 2019-06-30 ENCOUNTER — Ambulatory Visit (HOSPITAL_COMMUNITY): Payer: Medicare Other | Admitting: Physician Assistant

## 2019-06-30 ENCOUNTER — Ambulatory Visit (HOSPITAL_COMMUNITY): Payer: Medicare Other

## 2019-06-30 ENCOUNTER — Ambulatory Visit (HOSPITAL_COMMUNITY): Payer: Medicare Other | Admitting: Anesthesiology

## 2019-06-30 ENCOUNTER — Ambulatory Visit (HOSPITAL_COMMUNITY)
Admission: RE | Admit: 2019-06-30 | Discharge: 2019-07-01 | Disposition: A | Discharge status: Home / Self Care | Payer: Medicare Other | Attending: Otolaryngology | Admitting: Otolaryngology

## 2019-06-30 ENCOUNTER — Encounter (HOSPITAL_COMMUNITY)
Admission: RE | Disposition: A | Discharge status: Home / Self Care | Payer: Self-pay | Source: Home / Self Care | Attending: Otolaryngology

## 2019-06-30 ENCOUNTER — Other Ambulatory Visit: Payer: Self-pay

## 2019-06-30 DIAGNOSIS — E1151 Type 2 diabetes mellitus with diabetic peripheral angiopathy without gangrene: Secondary | ICD-10-CM | POA: Diagnosis not present

## 2019-06-30 DIAGNOSIS — Z85828 Personal history of other malignant neoplasm of skin: Secondary | ICD-10-CM | POA: Diagnosis not present

## 2019-06-30 DIAGNOSIS — J449 Chronic obstructive pulmonary disease, unspecified: Secondary | ICD-10-CM | POA: Diagnosis not present

## 2019-06-30 DIAGNOSIS — G4733 Obstructive sleep apnea (adult) (pediatric): Secondary | ICD-10-CM | POA: Diagnosis present

## 2019-06-30 DIAGNOSIS — K219 Gastro-esophageal reflux disease without esophagitis: Secondary | ICD-10-CM | POA: Insufficient documentation

## 2019-06-30 DIAGNOSIS — Z888 Allergy status to other drugs, medicaments and biological substances status: Secondary | ICD-10-CM | POA: Insufficient documentation

## 2019-06-30 DIAGNOSIS — I2510 Atherosclerotic heart disease of native coronary artery without angina pectoris: Secondary | ICD-10-CM | POA: Diagnosis not present

## 2019-06-30 DIAGNOSIS — Z886 Allergy status to analgesic agent status: Secondary | ICD-10-CM | POA: Diagnosis not present

## 2019-06-30 DIAGNOSIS — Z79899 Other long term (current) drug therapy: Secondary | ICD-10-CM | POA: Diagnosis not present

## 2019-06-30 DIAGNOSIS — Z8249 Family history of ischemic heart disease and other diseases of the circulatory system: Secondary | ICD-10-CM | POA: Diagnosis not present

## 2019-06-30 DIAGNOSIS — M797 Fibromyalgia: Secondary | ICD-10-CM | POA: Diagnosis not present

## 2019-06-30 DIAGNOSIS — Z87891 Personal history of nicotine dependence: Secondary | ICD-10-CM | POA: Insufficient documentation

## 2019-06-30 DIAGNOSIS — Z885 Allergy status to narcotic agent status: Secondary | ICD-10-CM | POA: Insufficient documentation

## 2019-06-30 DIAGNOSIS — Z880 Allergy status to penicillin: Secondary | ICD-10-CM | POA: Insufficient documentation

## 2019-06-30 DIAGNOSIS — K589 Irritable bowel syndrome without diarrhea: Secondary | ICD-10-CM | POA: Insufficient documentation

## 2019-06-30 DIAGNOSIS — F419 Anxiety disorder, unspecified: Secondary | ICD-10-CM | POA: Diagnosis not present

## 2019-06-30 DIAGNOSIS — Z9889 Other specified postprocedural states: Secondary | ICD-10-CM

## 2019-06-30 DIAGNOSIS — Z7984 Long term (current) use of oral hypoglycemic drugs: Secondary | ICD-10-CM | POA: Diagnosis not present

## 2019-06-30 DIAGNOSIS — E113293 Type 2 diabetes mellitus with mild nonproliferative diabetic retinopathy without macular edema, bilateral: Secondary | ICD-10-CM | POA: Insufficient documentation

## 2019-06-30 DIAGNOSIS — Z8619 Personal history of other infectious and parasitic diseases: Secondary | ICD-10-CM | POA: Insufficient documentation

## 2019-06-30 DIAGNOSIS — F329 Major depressive disorder, single episode, unspecified: Secondary | ICD-10-CM | POA: Diagnosis not present

## 2019-06-30 DIAGNOSIS — E1142 Type 2 diabetes mellitus with diabetic polyneuropathy: Secondary | ICD-10-CM | POA: Diagnosis not present

## 2019-06-30 DIAGNOSIS — I10 Essential (primary) hypertension: Secondary | ICD-10-CM | POA: Diagnosis not present

## 2019-06-30 HISTORY — PX: IMPLANTATION OF HYPOGLOSSAL NERVE STIMULATOR: SHX6827

## 2019-06-30 LAB — GLUCOSE, CAPILLARY
Glucose-Capillary: 102 mg/dL — ABNORMAL HIGH (ref 70–99)
Glucose-Capillary: 96 mg/dL (ref 70–99)
Glucose-Capillary: 134 mg/dL — ABNORMAL HIGH (ref 70–99)
Glucose-Capillary: 143 mg/dL — ABNORMAL HIGH (ref 70–99)
Glucose-Capillary: 258 mg/dL — ABNORMAL HIGH (ref 70–99)

## 2019-06-30 SURGERY — INSERTION, HYPOGLOSSAL NERVE STIMULATOR
Anesthesia: General | Site: Chest | Laterality: Right

## 2019-06-30 MED ORDER — SUCCINYLCHOLINE CHLORIDE 200 MG/10ML IV SOSY
PREFILLED_SYRINGE | INTRAVENOUS | Status: AC
  Filled 2019-06-30: qty 10

## 2019-06-30 MED ORDER — EZETIMIBE 10 MG PO TABS
10.0000 mg | ORAL_TABLET | Freq: Every day | ORAL | Status: DC
  Administered 2019-06-30: 10 mg via ORAL
  Filled 2019-06-30: qty 1

## 2019-06-30 MED ORDER — 0.9 % SODIUM CHLORIDE (POUR BTL) OPTIME
TOPICAL | Status: DC | PRN
  Administered 2019-06-30: 1000 mL

## 2019-06-30 MED ORDER — MIDAZOLAM HCL 2 MG/2ML IJ SOLN
INTRAMUSCULAR | Status: DC | PRN
  Administered 2019-06-30: 2 mg via INTRAVENOUS

## 2019-06-30 MED ORDER — IBUPROFEN 100 MG/5ML PO SUSP: 400.0000 mg | Freq: Four times a day (QID) | ORAL | Status: DC | PRN

## 2019-06-30 MED ORDER — LIDOCAINE-EPINEPHRINE 1 %-1:100000 IJ SOLN
INTRAMUSCULAR | Status: DC | PRN
  Administered 2019-06-30: 7 mL

## 2019-06-30 MED ORDER — DEXAMETHASONE SODIUM PHOSPHATE 10 MG/ML IJ SOLN
INTRAMUSCULAR | Status: DC | PRN
  Administered 2019-06-30: 5 mg via INTRAVENOUS

## 2019-06-30 MED ORDER — CLINDAMYCIN PHOSPHATE 900 MG/50ML IV SOLN
INTRAVENOUS | Status: AC
  Filled 2019-06-30: qty 50

## 2019-06-30 MED ORDER — PHENYLEPHRINE 40 MCG/ML (10ML) SYRINGE FOR IV PUSH (FOR BLOOD PRESSURE SUPPORT)
PREFILLED_SYRINGE | INTRAVENOUS | Status: AC
  Filled 2019-06-30: qty 10

## 2019-06-30 MED ORDER — FENTANYL CITRATE (PF) 250 MCG/5ML IJ SOLN
INTRAMUSCULAR | Status: DC | PRN
  Administered 2019-06-30: 100 ug via INTRAVENOUS

## 2019-06-30 MED ORDER — HYDROCODONE-ACETAMINOPHEN 5-325 MG PO TABS: 1.0000 | ORAL_TABLET | Freq: Four times a day (QID) | ORAL | 0 refills | Status: AC | PRN

## 2019-06-30 MED ORDER — CLINDAMYCIN PHOSPHATE 900 MG/50ML IV SOLN
900.0000 mg | INTRAVENOUS | Status: AC
  Administered 2019-06-30: 13:00:00 900 mg via INTRAVENOUS

## 2019-06-30 MED ORDER — CYCLOSPORINE 0.05 % OP EMUL
1.0000 [drp] | Freq: Two times a day (BID) | OPHTHALMIC | Status: DC
  Administered 2019-06-30: 1 [drp] via OPHTHALMIC
  Filled 2019-06-30 (×2): qty 30

## 2019-06-30 MED ORDER — METFORMIN HCL 500 MG PO TABS
500.0000 mg | ORAL_TABLET | Freq: Two times a day (BID) | ORAL | Status: DC
  Administered 2019-06-30 – 2019-07-01 (×2): 500 mg via ORAL
  Filled 2019-06-30 (×2): qty 1

## 2019-06-30 MED ORDER — FENTANYL CITRATE (PF) 100 MCG/2ML IJ SOLN
25.0000 ug | INTRAMUSCULAR | Status: DC | PRN
  Administered 2019-06-30 (×2): 25 ug via INTRAVENOUS

## 2019-06-30 MED ORDER — PROPOFOL 10 MG/ML IV BOLUS
INTRAVENOUS | Status: DC | PRN
  Administered 2019-06-30: 100 mg via INTRAVENOUS

## 2019-06-30 MED ORDER — LIDOCAINE 2% (20 MG/ML) 5 ML SYRINGE
INTRAMUSCULAR | Status: DC | PRN
  Administered 2019-06-30: 40 mg via INTRAVENOUS

## 2019-06-30 MED ORDER — ONDANSETRON HCL 4 MG/2ML IJ SOLN
INTRAMUSCULAR | Status: AC
  Filled 2019-06-30: qty 2

## 2019-06-30 MED ORDER — EPHEDRINE 5 MG/ML INJ
INTRAVENOUS | Status: AC
  Filled 2019-06-30: qty 10

## 2019-06-30 MED ORDER — DEXAMETHASONE SODIUM PHOSPHATE 10 MG/ML IJ SOLN
INTRAMUSCULAR | Status: AC
  Filled 2019-06-30: qty 1

## 2019-06-30 MED ORDER — FENTANYL CITRATE (PF) 100 MCG/2ML IJ SOLN
INTRAMUSCULAR | Status: AC
  Filled 2019-06-30: qty 2

## 2019-06-30 MED ORDER — SODIUM CHLORIDE 0.9 % IV SOLN
0.0500 ug/kg/min | INTRAVENOUS | Status: AC
  Administered 2019-06-30: .25 ug/kg/min via INTRAVENOUS
  Filled 2019-06-30: qty 2000

## 2019-06-30 MED ORDER — EPHEDRINE SULFATE-NACL 50-0.9 MG/10ML-% IV SOSY
PREFILLED_SYRINGE | INTRAVENOUS | Status: DC | PRN
  Administered 2019-06-30 (×2): 10 mg via INTRAVENOUS

## 2019-06-30 MED ORDER — DEXTROSE IN LACTATED RINGERS 5 % IV SOLN
INTRAVENOUS | Status: DC
  Administered 2019-06-30: 19:00:00 via INTRAVENOUS

## 2019-06-30 MED ORDER — PHENYLEPHRINE HCL-NACL 10-0.9 MG/250ML-% IV SOLN
INTRAVENOUS | Status: DC | PRN
  Administered 2019-06-30: 50 ug/min via INTRAVENOUS

## 2019-06-30 MED ORDER — ONDANSETRON HCL 4 MG/2ML IJ SOLN: 4.0000 mg | INTRAMUSCULAR | Status: DC | PRN

## 2019-06-30 MED ORDER — PHENYLEPHRINE HCL (PRESSORS) 10 MG/ML IV SOLN
INTRAVENOUS | Status: AC
  Filled 2019-06-30: qty 1

## 2019-06-30 MED ORDER — LACTATED RINGERS IV SOLN
INTRAVENOUS | Status: DC
  Administered 2019-06-30 (×2): via INTRAVENOUS

## 2019-06-30 MED ORDER — PROPOFOL 10 MG/ML IV BOLUS
INTRAVENOUS | Status: AC
  Filled 2019-06-30: qty 20

## 2019-06-30 MED ORDER — STERILE WATER FOR IRRIGATION IR SOLN
Status: DC | PRN
  Administered 2019-06-30: 1000 mL

## 2019-06-30 MED ORDER — AMLODIPINE BESYLATE 5 MG PO TABS
5.0000 mg | ORAL_TABLET | Freq: Every day | ORAL | Status: DC
  Administered 2019-06-30: 5 mg via ORAL
  Filled 2019-06-30: qty 1

## 2019-06-30 MED ORDER — PANTOPRAZOLE SODIUM 40 MG PO TBEC
40.0000 mg | DELAYED_RELEASE_TABLET | ORAL | Status: DC
  Administered 2019-07-01: 40 mg via ORAL
  Filled 2019-06-30: qty 1

## 2019-06-30 MED ORDER — ONDANSETRON HCL 4 MG PO TABS: 4.0000 mg | ORAL_TABLET | ORAL | Status: DC | PRN

## 2019-06-30 MED ORDER — ONDANSETRON HCL 4 MG/2ML IJ SOLN: 4.0000 mg | Freq: Once | INTRAMUSCULAR | Status: DC | PRN

## 2019-06-30 MED ORDER — LIDOCAINE 2% (20 MG/ML) 5 ML SYRINGE
INTRAMUSCULAR | Status: AC
  Filled 2019-06-30: qty 5

## 2019-06-30 MED ORDER — VENLAFAXINE HCL ER 75 MG PO CP24
75.0000 mg | ORAL_CAPSULE | Freq: Every day | ORAL | Status: DC
  Administered 2019-06-30: 75 mg via ORAL
  Filled 2019-06-30: qty 1

## 2019-06-30 MED ORDER — HYDROCODONE-ACETAMINOPHEN 7.5-325 MG/15ML PO SOLN
10.0000 mL | ORAL | Status: DC | PRN
  Administered 2019-06-30: 15 mL via ORAL
  Filled 2019-06-30: qty 15

## 2019-06-30 MED ORDER — LIDOCAINE-EPINEPHRINE 1 %-1:100000 IJ SOLN
INTRAMUSCULAR | Status: AC
  Filled 2019-06-30: qty 1

## 2019-06-30 MED ORDER — ONDANSETRON HCL 4 MG/2ML IJ SOLN
INTRAMUSCULAR | Status: DC | PRN
  Administered 2019-06-30: 4 mg via INTRAVENOUS

## 2019-06-30 MED ORDER — BUSPIRONE HCL 5 MG PO TABS
5.0000 mg | ORAL_TABLET | Freq: Every day | ORAL | Status: DC
  Administered 2019-06-30: 5 mg via ORAL
  Filled 2019-06-30: qty 1

## 2019-06-30 MED ORDER — SUCCINYLCHOLINE CHLORIDE 200 MG/10ML IV SOSY
PREFILLED_SYRINGE | INTRAVENOUS | Status: DC | PRN
  Administered 2019-06-30: 80 mg via INTRAVENOUS

## 2019-06-30 MED ORDER — MIDAZOLAM HCL 2 MG/2ML IJ SOLN
INTRAMUSCULAR | Status: AC
  Filled 2019-06-30: qty 2

## 2019-06-30 MED ORDER — PHENYLEPHRINE 40 MCG/ML (10ML) SYRINGE FOR IV PUSH (FOR BLOOD PRESSURE SUPPORT)
PREFILLED_SYRINGE | INTRAVENOUS | Status: DC | PRN
  Administered 2019-06-30: 80 ug via INTRAVENOUS
  Administered 2019-06-30: 120 ug via INTRAVENOUS

## 2019-06-30 MED ORDER — FENTANYL CITRATE (PF) 250 MCG/5ML IJ SOLN
INTRAMUSCULAR | Status: AC
  Filled 2019-06-30: qty 5

## 2019-06-30 SURGICAL SUPPLY — 66 items
BAG DECANTER FOR FLEXI CONT (MISCELLANEOUS) ×3 IMPLANT
BLADE SURG 15 STRL LF DISP TIS (BLADE) ×3 IMPLANT
BLADE SURG 15 STRL SS (BLADE) ×6
CANISTER SUCT 3000ML PPV (MISCELLANEOUS) ×3 IMPLANT
CORD BIPOLAR FORCEPS 12FT (ELECTRODE) ×3 IMPLANT
COVER PROBE W GEL 5X96 (DRAPES) ×3 IMPLANT
COVER SURGICAL LIGHT HANDLE (MISCELLANEOUS) ×3 IMPLANT
DERMABOND ADVANCED (GAUZE/BANDAGES/DRESSINGS) ×4
DERMABOND ADVANCED .7 DNX12 (GAUZE/BANDAGES/DRESSINGS) ×2 IMPLANT
DRAPE C-ARM 35X43 STRL (DRAPES) ×3 IMPLANT
DRAPE HALF SHEET 40X57 (DRAPES) ×3 IMPLANT
DRAPE HEAD BAR (DRAPES) ×3 IMPLANT
DRAPE INCISE IOBAN 66X45 STRL (DRAPES) ×3 IMPLANT
DRAPE MICROSCOPE LEICA 54X105 (DRAPE) ×3 IMPLANT
DRAPE UTILITY XL STRL (DRAPES) ×3 IMPLANT
DRSG TEGADERM 2-3/8X2-3/4 SM (GAUZE/BANDAGES/DRESSINGS) ×6 IMPLANT
ELECT COATED BLADE 2.86 ST (ELECTRODE) ×3 IMPLANT
ELECT EMG 18 NIMS (NEUROSURGERY SUPPLIES) ×3
ELECTRODE EMG 18 NIMS (NEUROSURGERY SUPPLIES) ×1 IMPLANT
FORCEPS BIPOLAR SPETZLER 8 1.0 (NEUROSURGERY SUPPLIES) ×3 IMPLANT
GAUZE 4X4 16PLY RFD (DISPOSABLE) ×3 IMPLANT
GAUZE SPONGE 4X4 12PLY STRL (GAUZE/BANDAGES/DRESSINGS) ×6 IMPLANT
GENERATOR PULSE INSPIRE (Generator) ×3 IMPLANT
GLOVE BIO SURGEON STRL SZ 6.5 (GLOVE) ×2 IMPLANT
GLOVE BIO SURGEON STRL SZ7 (GLOVE) ×12 IMPLANT
GLOVE BIO SURGEONS STRL SZ 6.5 (GLOVE) ×1
GLOVE BIOGEL M 7.0 STRL (GLOVE) ×3 IMPLANT
GLOVE BIOGEL PI IND STRL 7.0 (GLOVE) ×2 IMPLANT
GLOVE BIOGEL PI INDICATOR 7.0 (GLOVE) ×4
GOWN STRL REUS W/ TWL LRG LVL3 (GOWN DISPOSABLE) ×4 IMPLANT
GOWN STRL REUS W/TWL LRG LVL3 (GOWN DISPOSABLE) ×8
KIT BASIN OR (CUSTOM PROCEDURE TRAY) ×3 IMPLANT
KIT TURNOVER KIT B (KITS) ×3 IMPLANT
LEAD SENSING RESP INSPIRE (Lead) ×3 IMPLANT
LEAD SLEEP STIMULATION INSPIRE (Lead) ×3 IMPLANT
LOOP VESSEL MAXI BLUE (MISCELLANEOUS) ×3 IMPLANT
LOOP VESSEL MINI RED (MISCELLANEOUS) ×3 IMPLANT
MARKER SKIN DUAL TIP RULER LAB (MISCELLANEOUS) ×6 IMPLANT
NEEDLE HYPO 25GX1X1/2 BEV (NEEDLE) ×3 IMPLANT
NS IRRIG 1000ML POUR BTL (IV SOLUTION) ×3 IMPLANT
PAD ARMBOARD 7.5X6 YLW CONV (MISCELLANEOUS) ×3 IMPLANT
PASSER CATH 38CM DISP (INSTRUMENTS) ×3 IMPLANT
PENCIL BUTTON HOLSTER BLD 10FT (ELECTRODE) ×3 IMPLANT
POSITIONER HEAD DONUT 9IN (MISCELLANEOUS) ×3 IMPLANT
PROBE NERVE STIMULATOR (NEUROSURGERY SUPPLIES) ×3 IMPLANT
REMOTE CONTROL SLEEP INSPIRE (MISCELLANEOUS) ×3 IMPLANT
SET WALTER ACTIVATION W/DRAPE (SET/KITS/TRAYS/PACK) ×3 IMPLANT
SLING ARM FOAM STRAP MED (SOFTGOODS) ×3 IMPLANT
SPONGE INTESTINAL PEANUT (DISPOSABLE) ×3 IMPLANT
STAPLER VISISTAT 35W (STAPLE) ×6 IMPLANT
SUT SILK 2 0 SH (SUTURE) ×3 IMPLANT
SUT SILK 3 0 RB1 (SUTURE) ×3 IMPLANT
SUT SILK 3 0 REEL (SUTURE) ×3 IMPLANT
SUT SILK 3 0 SH 30 (SUTURE) ×6 IMPLANT
SUT SILK 3-0 (SUTURE) ×4
SUT SILK 3-0 RB1 30XBRD (SUTURE) ×2
SUT VIC AB 3-0 SH 27 (SUTURE) ×4
SUT VIC AB 3-0 SH 27X BRD (SUTURE) ×2 IMPLANT
SUT VIC AB 4-0 PS2 27 (SUTURE) ×6 IMPLANT
SUT VIC AB 5-0 P-3 18XBRD (SUTURE) ×2 IMPLANT
SUT VIC AB 5-0 P3 18 (SUTURE) ×4
SUTURE SILK 3-0 RB1 30XBRD (SUTURE) ×2 IMPLANT
SYR 10ML LL (SYRINGE) ×3 IMPLANT
TAPE CLOTH SURG 4X10 WHT LF (GAUZE/BANDAGES/DRESSINGS) ×3 IMPLANT
TOWEL GREEN STERILE (TOWEL DISPOSABLE) ×3 IMPLANT
TRAY ENT MC OR (CUSTOM PROCEDURE TRAY) ×3 IMPLANT

## 2019-06-30 NOTE — Plan of Care (Signed)

## 2019-06-30 NOTE — Progress Notes (Signed)
Pt arrived to room 6N25 via bed after surgery. Received report from Arnette Norris, RN in PACU. See assessment. Will continue to monitor.

## 2019-06-30 NOTE — Progress Notes (Signed)
   ENT Progress Note: s/p Procedure(s): IMPLANTATION OF HYPOGLOSSAL NERVE STIMULATOR   Subjective: C/O incisional pain, sore chest  Objective: Vital signs in last 24 hours: Temp:  [98.2 F (36.8 C)-98.4 F (36.9 C)] 98.4 F (36.9 C) (12/09 1725) Pulse Rate:  [99-121] 111 (12/09 1715) Resp:  [10-21] 17 (12/09 1715) BP: (106-171)/(62-125) 130/70 (12/09 1711) SpO2:  [89 %-96 %] 95 % (12/09 1715) Weight change:     Intake/Output from previous day: No intake/output data recorded. Intake/Output this shift: Total I/O In: 1100 [I.V.:1100] Out: 465 [Urine:450; Blood:15]  Labs: Recent Labs    06/28/19 0820  WBC 6.4  HGB 13.3  HCT 41.0  PLT 259   Recent Labs    06/28/19 0820  NA 139  K 3.9  CL 100  CO2 27  GLUCOSE 109*  BUN 13  CALCIUM 9.3    Studies/Results: Dg Neck Soft Tissue  Result Date: 06/30/2019 CLINICAL DATA:  Placement of hypoglossal nerve stimulator. EXAM: NECK SOFT TISSUES - 1+ VIEW COMPARISON:  None. FINDINGS: Coiled wires with nerve stimulator tip located in the submandibular region. IMPRESSION: Coiled wires and nerve stimulator tip in the submandibular region. Electronically Signed   By: Marijo Sanes M.D.   On: 06/30/2019 16:08   Dg Chest Port 1 View  Result Date: 06/30/2019 CLINICAL DATA:  Implantation of hypoglossal nerve stimulator. EXAM: PORTABLE CHEST 1 VIEW COMPARISON:  11/21/2018 FINDINGS: The cardiac silhouette, mediastinal and hilar contours are stable. Stable tortuosity and calcification of the thoracic aorta. Right brachiocephalic artery stent is noted. Chronic scarring changes in both lungs but no acute pulmonary findings. There is a nerve stimulator battery pack noted overlying the right upper chest with a wire extending up in the submandibular area. IMPRESSION: 1. No acute cardiopulmonary findings. Chronic pulmonary scarring changes in the upper lobes in particular. 2. Hypoglossal nerve stimulator noted. Electronically Signed   By: Marijo Sanes M.D.   On: 06/30/2019 16:12     PHYSICAL EXAM: Inc intact -  No swelling Weakness of Rt marginal n.   Assessment/Plan: Stable O/N obs    Jerrell Belfast 06/30/2019, 5:34 PM

## 2019-06-30 NOTE — H&P (Signed)
Lori Byrd is an 68 y.o. female.   Chief Complaint: OSA HPI: Progressive OSA, unable to wear CPAP  Past Medical History:  Diagnosis Date  . Anxiety   . Cancer ([place])    skin cancer  . COPD (chronic obstructive pulmonary disease) ([place])   . Coronary artery disease   . Depression   . Diabetes mellitus without complication ([place])   . Fibromyalgia   . GERD (gastroesophageal reflux disease)   . Hypertension   . IBS (irritable bowel syndrome)     Past Surgical History:  Procedure Laterality Date  . CARDIAC CATHETERIZATION    . DRUG INDUCED ENDOSCOPY N/A 02/19/2019   Procedure: DRUG INDUCED SLEEP ENDOSCOPY;  Surgeon: Jerrell Belfast, MD;  Location: [facility];  Service: ENT;  Laterality: N/A;  . EYE SURGERY Bilateral    cataract  . SKIN BIOPSY    . SMALL INTESTINE SURGERY    . TUBAL LIGATION    . two carotid stent      Family History  Problem Relation Age of Onset  . Heart failure Mother   . Diabetes Mother   . Diabetes Maternal Grandmother    Social History:  reports that she has quit smoking. Her smoking use included cigarettes. She has never used smokeless tobacco. She reports previous alcohol use. She reports that she does not use drugs.  Allergies:  Allergies  Allergen Reactions  . Penicillins Anaphylaxis    Did it involve swelling of the face/tongue/throat, SOB, or low BP? Yes Did it involve sudden or severe rash/hives, skin peeling, or any reaction on the inside of your mouth or nose? Yes Did you need to seek medical attention at a hospital or doctor's office? Yes When did it last happen?childhood  If all above answers are "NO", may proceed with cephalosporin use.   Diona Fanti [Aspirin]     Per Dr not to take  . Grass Extracts [Gramineae Pollens] Other (See Comments)    Environmental - sneezing   . Morphine And Related     Pt preference   . Toradol [Ketorolac Tromethamine]     Pt preference     Medications Prior to Admission   Medication Sig Dispense Refill  . amLODipine (NORVASC) 5 MG tablet Take 5 mg by mouth daily.    . Ascorbic Acid (VITAMIN C) 1000 MG tablet Take 1,000 mg by mouth daily.    . busPIRone (BUSPAR) 5 MG tablet Take 5 mg by mouth at bedtime.    . clopidogrel (PLAVIX) 75 MG tablet Take 75 mg by mouth daily.     Marland Kitchen ezetimibe (ZETIA) 10 MG tablet Take 10 mg by mouth daily.    . metFORMIN (GLUCOPHAGE) 500 MG tablet Take 500 mg by mouth 2 (two) times daily.     . Omega-3 Fatty Acids (FISH OIL CONCENTRATE PO) Take 1 capsule by mouth 2 (two) times daily.     . pantoprazole (PROTONIX) 40 MG tablet Take 40 mg by mouth every morning.     . RESTASIS 0.05 % ophthalmic emulsion Place 1 drop into both eyes 2 (two) times daily.    Marland Kitchen venlafaxine XR (EFFEXOR-XR) 75 MG 24 hr capsule Take 75 mg by mouth at bedtime.     Marland Kitchen alendronate (FOSAMAX) 70 MG tablet Take 70 mg by mouth once a week. Sunday    . metoprolol succinate (TOPROL-XL) 50 MG 24 hr tablet Take 50 mg by mouth daily.    . Multiple Vitamin (MULTI-VITAMIN PO) Take 1 tablet  by mouth daily.       Results for orders placed or performed during the hospital encounter of 06/30/19 (from the past 48 hour(s))  Glucose, capillary     Status: Abnormal   Collection Time: 06/30/19  8:57 AM  Result Value Ref Range   Glucose-Capillary 102 (H) 70 - 99 mg/dL   Comment 1 Notify RN    No results found.  Review of Systems  Constitutional: Negative.   HENT: Negative.   Respiratory: Negative.     Blood pressure 106/87, pulse 99, temperature 98.2 F (36.8 C), temperature source Oral, resp. rate 20, SpO2 96 %. Physical Exam  Constitutional: She appears well-developed and well-nourished.  Neck: Normal range of motion. Neck supple.  Cardiovascular: Normal rate.  Respiratory: Effort normal.     Assessment/Plan Adm for Hypoglossal N Stimulator ([employer]spire Implant) under GA with O/N obs  Jerrell Belfast, MD 06/30/2019, 11:44 AM

## 2019-06-30 NOTE — Anesthesia Postprocedure Evaluation (Signed)
Anesthesia Post Note  Patient: Lori Byrd  Procedure(s) Performed: IMPLANTATION OF HYPOGLOSSAL NERVE STIMULATOR (Right Chest)     Patient location during evaluation: PACU Anesthesia Type: General Level of consciousness: awake and alert, oriented and patient cooperative Pain management: pain level controlled Vital Signs Assessment: post-procedure vital signs reviewed and stable Respiratory status: spontaneous breathing, nonlabored ventilation and respiratory function stable Cardiovascular status: blood pressure returned to baseline and stable Postop Assessment: no apparent nausea or vomiting Anesthetic complications: no    Last Vitals:  Vitals:   06/30/19 1537 06/30/19 1545  BP: (!) 159/74   Pulse: (!) 116 (!) 117  Resp: 18 10  Temp:    SpO2: (!) 89% 90%    Last Pain:  Vitals:   06/30/19 1520  TempSrc:   PainSc: 0-No pain                 Pervis Hocking

## 2019-06-30 NOTE — Anesthesia Procedure Notes (Signed)
Procedure Name: Intubation Date/Time: 06/30/2019 1:08 PM Performed by: Kathryne Hitch, CRNA Pre-anesthesia Checklist: Patient identified, Emergency Drugs available, Suction available and Patient being monitored Patient Re-evaluated:Patient Re-evaluated prior to induction Oxygen Delivery Method: Circle system utilized Preoxygenation: Pre-oxygenation with 100% oxygen Induction Type: IV induction Ventilation: Mask ventilation without difficulty Laryngoscope Size: Mac and 3 Grade View: Grade I Tube type: Oral Tube size: 7.0 mm Number of attempts: 2 Airway Equipment and Method: Stylet and Oral airway Placement Confirmation: ETT inserted through vocal cords under direct vision,  positive ETCO2 and breath sounds checked- equal and bilateral Secured at: 22 cm Tube secured with: Tape Dental Injury: Teeth and Oropharynx as per pre-operative assessment

## 2019-06-30 NOTE — Op Note (Signed)
Operative Note: INSPIRE IMPLANT  Patient: Lori Byrd  Medical record number: [id number]  Date:06/30/2019  Pre-operative Indications: Moderate/severe obstructive sleep apnea with positive airway pressure intolerance  Postoperative Indications: Same  Surgical Procedure:  1.  12th cranial nerve (hypoglossal) stimulation implant    2.  Placement of chest wall respiratory sensor    3.  Electronic analysis of implanted neurostimulator with pulse generator system  Anesthesia: GET  Surgeon: Delsa Bern, M.D.  Assist: Jolene Provost, PA  PA [place]'s assistance was required throughout the surgical procedure including surgical planning, retraction, management of bleeding and surgical decision-making throughout the operation.  Complications: None  EBL: 50cc   Brief History: The patient is a 68 y.o. female with a history of moderate/severe obstructive sleep apnea.  The patient has undergone work-up including sleep study and trial of CPAP which they did not tolerate.  Patient is unable to use long-term CPAP for control of obstructive sleep apnea.  Patient underwent DISE which showed anterior to posterior airway obstruction, considered a good candidate for Inspire Implant. Given the patient's history and findings, I recommended Inspire Implant under general anesthesia, risks and benefits were discussed in detail with the patient and family. They understand and agree with our plan for surgery which is scheduled at [facility] on an elective basis.  Surgical Procedure: The patient is brought to the operating room on 06/30/2019 and placed in supine position on the operating table. General endotracheal anesthesia was established without difficulty. When the patient was adequately anesthetized, surgical timeout was performed and correct identification of the patient and the surgical procedure. The patient was injected with 5 cc of 1% lidocaine 1:100,000 dilution epinephrine in  subcutaneous fashion in the proposed skin incisions.  The patient was positioned and prepped and draped in sterile fashion.  The NIMS monitoring system was positioned and electrodes were placed in the anterior floor of mouth and tongue to assess the genioglossus and styloglossus muscle groups.  Nerve monitoring was used throughout the surgical procedure.  The procedure was begun by creating a modified right submandibular incision in the upper anterior lateral neck ~2 cm below the mandible and in a natural skin crease.  The incision was carried through the skin and underlying subcutaneous tissue to the level of the platysma.  Platysma muscle was divided and subplatysmal flaps were elevated superiorly and inferiorly.  The submandibular space was entered and the submandibular gland was identified and retracted posteriorly.  The digastric tendon was identified and dissection was carried out anterior and posterior along the tendon and muscle bellies.  The mylohyoid muscle was identified and retracted anteriorly and the hypoglossal nerve was carefully dissected across the anterior floor of the submandibular space.  Lateral branches to the retrusor muscles were identified and tested intraoperatively using the NIMS stimulator.  Stimulation electrode cuff for the hypoglossal nerve stimulator was placed distal to these branches on the medial hypoglossal nerve branch innervating the genioglossus muscle.  Diagnostic evaluation confirmed activation of the genioglossus muscle, resulting in genioglossal activation and tongue protrusion which was visually confirmed in the operating room.  The stimulation electrode was  sutured to the digastric tendon with interrupted 4-0 silk sutures.  A second second incision was created in the anterior upper chest wall 5 cm below the clavicle.  Incision was carried through the skin and underlying subcutaneous tissue to the level of the pectoralis major muscle.  The IPG pocket was created deep  to the subcutaneous layer and superficial to the muscle.  Stimulation lead was then tunneled in a subplatysmal plane and brought out into the subclavicular pocket.  Third incision was made in the axillary line measuring approximately 5 cm.  Dissection was carried down through the skin and underlying subcutaneous tissue.  The serratus anterior muscle was identified and separated.  The external oblique muscle was identified and incised and a tunnel was created between the external and internal intercostal muscles in the approximately fifth intercostal space on the superior edge of the sixth rib.  The pleural sensor was then placed in the intercostal pocket.  Leads were sutured with 3-0 silk suture to the provided anchors which were used to fix the sensor in its proper orientation facing the pleural space.  The sensing lead was then tunneled in a subcutaneous plane to the subclavicular pocket.  The stimulating electrode and respiration sensing lead were connected to the implantable pulse generator.  Diagnostic evaluation was run confirming respiratory signal and good tongue protrusion on stimulation.  The implantable pulse generator was then placed in the subclavicular pocket and sutured to the pectoralis fascia with 4-0 silk sutures sutures.  The incisions were thoroughly irrigated with bacitracin saline irrigation.  The incisions were then closed in multiple layers with 4-0 and 5-0 Vicryl interrupted sutures in the deep and superficial subcutaneous levels at each incision site.  Dermabond surgical glue was used for skin closure.  The patient's incisions were dressed with rolled gauze and Hypafix tape for pressure dressing.  The patient's right arm was placed in a sling.  An orogastric tube was passed and stomach contents were aspirated. Patient was awakened from anesthetic and transferred from the operating room to the recovery room in stable condition. There were no complications and blood loss was minimal.   X-rays were obtained in the recovery room to assess the proper location of the pulse generator, sensor lead and stimulation lead.   Delsa Bern, M.D. [facility] 06/30/2019

## 2019-06-30 NOTE — Transfer of Care (Signed)
Immediate Anesthesia Transfer of Care Note  Patient: Lori Byrd  Procedure(s) Performed: IMPLANTATION OF HYPOGLOSSAL NERVE STIMULATOR (Right Chest)  Patient Location: PACU  Anesthesia Type:General  Level of Consciousness: awake  Airway & Oxygen Therapy: Patient Spontanous Breathing and Patient connected to nasal cannula oxygen  Post-op Assessment: Report given to RN and Post -op Vital signs reviewed and stable  Post vital signs: Reviewed and stable  Last Vitals:  Vitals Value Taken Time  BP 171/125 06/30/19 1520  Temp    Pulse 116 06/30/19 1522  Resp 20 06/30/19 1522  SpO2 92 % 06/30/19 1522  Vitals shown include unvalidated device data.  Last Pain:  Vitals:   06/30/19 0901  TempSrc:   PainSc: 0-No pain      Patients Stated Pain Goal: 4 ([phone])  Complications: No apparent anesthesia complications

## 2019-07-01 DIAGNOSIS — G4733 Obstructive sleep apnea (adult) (pediatric): Secondary | ICD-10-CM | POA: Diagnosis not present

## 2019-07-01 LAB — GLUCOSE, CAPILLARY: Glucose-Capillary: 97 mg/dL (ref 70–99)

## 2019-07-01 MED ORDER — CLOPIDOGREL BISULFATE 75 MG PO TABS: 75.0000 mg | ORAL_TABLET | Freq: Every day | ORAL | 12 refills | Status: AC

## 2019-07-01 NOTE — Discharge Summary (Signed)
Physician Discharge Summary  Patient ID: Lori Byrd MRN: [id number] DOB/AGE: [birth year] 68 y.o.  Admit date: 06/30/2019 Discharge date: 07/01/2019  Admission Diagnoses:  Active Problems:   Obstructive sleep apnea   Discharge Diagnoses:  Same  Surgeries: Procedure(s): IMPLANTATION OF HYPOGLOSSAL NERVE STIMULATOR on 06/30/2019   Consultants: None  Discharged Condition: Improved  Hospital Course: Lori Byrd is an 68 y.o. female who was admitted 06/30/2019 with a diagnosis of Active Problems:   Obstructive sleep apnea  and went to the operating room on 06/30/2019 and underwent the above named procedures.   Stable on Postop day #1. D/C to home.  Recent vital signs:  Vitals:   07/01/19 0044 07/01/19 0441  BP: 123/64 117/66  Pulse: 99 (!) 105  Resp: 20 19  Temp: 98.2 F (36.8 C) (!) 97.5 F (36.4 C)  SpO2: 95% 97%    Recent laboratory studies:  Results for orders placed or performed during the hospital encounter of 06/30/19  Glucose, capillary  Result Value Ref Range   Glucose-Capillary 102 (H) 70 - 99 mg/dL   Comment 1 Notify RN   Glucose, capillary  Result Value Ref Range   Glucose-Capillary 96 70 - 99 mg/dL   Comment 1 Notify RN    Comment 2 Document in Chart   Glucose, capillary  Result Value Ref Range   Glucose-Capillary 134 (H) 70 - 99 mg/dL   Comment 1 Notify RN   Glucose, capillary  Result Value Ref Range   Glucose-Capillary 143 (H) 70 - 99 mg/dL  Glucose, capillary  Result Value Ref Range   Glucose-Capillary 258 (H) 70 - 99 mg/dL   Comment 1 Notify RN    Comment 2 Document in Chart     Discharge Medications:   Allergies as of 07/01/2019      Reactions   Penicillins Anaphylaxis   Did it involve swelling of the face/tongue/throat, SOB, or low BP? Yes Did it involve sudden or severe rash/hives, skin peeling, or any reaction on the inside of your mouth or nose? Yes Did you need to seek medical attention at a hospital or  doctor's office? Yes When did it last happen?childhood  If all above answers are "NO", may proceed with cephalosporin use.   Asa [aspirin]    Per Dr not to take   Grass Extracts [gramineae Pollens] Other (See Comments)   Environmental - sneezing    Morphine And Related    Pt preference    Toradol [ketorolac Tromethamine]    Pt preference       Medication List    TAKE these medications   alendronate 70 MG tablet Commonly known as: FOSAMAX Take 70 mg by mouth once a week. Sunday   amLODipine 5 MG tablet Commonly known as: NORVASC Take 5 mg by mouth daily.   busPIRone 5 MG tablet Commonly known as: BUSPAR Take 5 mg by mouth at bedtime.   clopidogrel 75 MG tablet Commonly known as: PLAVIX Take 1 tablet (75 mg total) by mouth daily. Restart plavix on 07/03/19 Start taking on: July 03, 2019 What changed:   additional instructions  These instructions start on July 03, 2019. If you are unsure what to do until then, ask your doctor or other care provider.   ezetimibe 10 MG tablet Commonly known as: ZETIA Take 10 mg by mouth daily.   FISH OIL CONCENTRATE PO Take 1 capsule by mouth 2 (two) times daily.   HYDROcodone-acetaminophen 5-325 MG tablet Commonly known as: Norco Take  1-2 tablets by mouth every 6 (six) hours as needed for up to 5 days for moderate pain. Alternate OTC Motrin 600mg  and Tylenol 650mg  every 6 hours as needed for pain. May substitute Norco (Tylenol/narcotic) for plain Tylenol as needed.   metFORMIN 500 MG tablet Commonly known as: GLUCOPHAGE Take 500 mg by mouth 2 (two) times daily.   metoprolol succinate 50 MG 24 hr tablet Commonly known as: TOPROL-XL Take 50 mg by mouth daily.   MULTI-VITAMIN PO Take 1 tablet by mouth daily.   pantoprazole 40 MG tablet Commonly known as: PROTONIX Take 40 mg by mouth every morning.   Restasis 0.05 % ophthalmic emulsion Generic drug: cycloSPORINE Place 1 drop into both eyes 2 (two) times daily.    venlafaxine XR 75 MG 24 hr capsule Commonly known as: EFFEXOR-XR Take 75 mg by mouth at bedtime.   vitamin C 1000 MG tablet Take 1,000 mg by mouth daily.       Diagnostic Studies: DG Neck Soft Tissue  Result Date: 06/30/2019 CLINICAL DATA:  Placement of hypoglossal nerve stimulator. EXAM: NECK SOFT TISSUES - 1+ VIEW COMPARISON:  None. FINDINGS: Coiled wires with nerve stimulator tip located in the submandibular region. IMPRESSION: Coiled wires and nerve stimulator tip in the submandibular region. Electronically Signed   By: Marijo Sanes M.D.   On: 06/30/2019 16:08   DG CHEST PORT 1 VIEW  Result Date: 06/30/2019 CLINICAL DATA:  Implantation of hypoglossal nerve stimulator. EXAM: PORTABLE CHEST 1 VIEW COMPARISON:  11/21/2018 FINDINGS: The cardiac silhouette, mediastinal and hilar contours are stable. Stable tortuosity and calcification of the thoracic aorta. Right brachiocephalic artery stent is noted. Chronic scarring changes in both lungs but no acute pulmonary findings. There is a nerve stimulator battery pack noted overlying the right upper chest with a wire extending up in the submandibular area. IMPRESSION: 1. No acute cardiopulmonary findings. Chronic pulmonary scarring changes in the upper lobes in particular. 2. Hypoglossal nerve stimulator noted. Electronically Signed   By: Marijo Sanes M.D.   On: 06/30/2019 16:12    Disposition: Discharge disposition: 01-Home or Self Care       Discharge Instructions    Diet - low sodium heart healthy   Complete by: As directed    Discharge instructions   Complete by: As directed    Postoperative Instructions after Inspire Implant:  1. Limited activity -do not lift arm above the shoulder for 2 weeks, no heavy lifting or straining for 4 weeks. 2. Liquid and soft diet, advance as tolerated 3. May bathe and shower day after surgery 4. Wound care - Gentle cleaning with soap and water 5. DO NOT APPLY ANY OINTMENT 6. Elevate Head of  Bed 7. Wear arm sling for 72 hours after surgery 8. Restart Plavix on 07/03/19  Please call [facility] at [phone] for any additional questions or concerns.   Increase activity slowly   Complete by: As directed       Follow-up Information    Jerrell Belfast, MD In 2 weeks.   Specialty: Otolaryngology Contact information: [address] [place] [address] [phone]            Signed: Jerrell Belfast 07/01/2019, 6:59 AM

## 2019-07-01 NOTE — Progress Notes (Signed)
AVS given and reviewed with pt. Medications discussed. All questions answered to satisfaction. Pt verbalized understanding of information given. Pt to be escorted off the unit with all belongings via wheelchair by volunteer services. 

## 2019-07-01 NOTE — Progress Notes (Signed)
   ENT Progress Note: POD #1 s/p Procedure(s): IMPLANTATION OF HYPOGLOSSAL NERVE STIMULATOR   Subjective: C/O tongue and mouth pain  Objective: Vital signs in last 24 hours: Temp:  [97.5 F (36.4 C)-98.4 F (36.9 C)] 97.5 F (36.4 C) (12/10 0441) Pulse Rate:  [99-121] 105 (12/10 0441) Resp:  [10-21] 19 (12/10 0441) BP: (90-171)/(62-125) 117/66 (12/10 0441) SpO2:  [89 %-97 %] 97 % (12/10 0441) Weight:  [85.2 kg] 85.2 kg (12/09 1847) Weight change:  Last BM Date: 06/30/19(pt reported)  Intake/Output from previous day: 12/09 0701 - 12/10 0700 In: 1250 [I.V.:1250] Out: 465 [Urine:450; Blood:15] Intake/Output this shift: No intake/output data recorded.  Labs: Recent Labs    06/28/19 0820  WBC 6.4  HGB 13.3  HCT 41.0  PLT 259   Recent Labs    06/28/19 0820  NA 139  K 3.9  CL 100  CO2 27  GLUCOSE 109*  BUN 13  CALCIUM 9.3    Studies/Results: DG Neck Soft Tissue  Result Date: 06/30/2019 CLINICAL DATA:  Placement of hypoglossal nerve stimulator. EXAM: NECK SOFT TISSUES - 1+ VIEW COMPARISON:  None. FINDINGS: Coiled wires with nerve stimulator tip located in the submandibular region. IMPRESSION: Coiled wires and nerve stimulator tip in the submandibular region. Electronically Signed   By: Marijo Sanes M.D.   On: 06/30/2019 16:08   DG CHEST PORT 1 VIEW  Result Date: 06/30/2019 CLINICAL DATA:  Implantation of hypoglossal nerve stimulator. EXAM: PORTABLE CHEST 1 VIEW COMPARISON:  11/21/2018 FINDINGS: The cardiac silhouette, mediastinal and hilar contours are stable. Stable tortuosity and calcification of the thoracic aorta. Right brachiocephalic artery stent is noted. Chronic scarring changes in both lungs but no acute pulmonary findings. There is a nerve stimulator battery pack noted overlying the right upper chest with a wire extending up in the submandibular area. IMPRESSION: 1. No acute cardiopulmonary findings. Chronic pulmonary scarring changes in the upper lobes in  particular. 2. Hypoglossal nerve stimulator noted. Electronically Signed   By: Marijo Sanes M.D.   On: 06/30/2019 16:12     PHYSICAL EXAM: Inc intact - min ecchymosis, no swelling Normal tongue mobility Mild marginal weakness   Assessment/Plan: Stable post op D/C to home    Jerrell Belfast 07/01/2019, 6:53 AM

## 2019-08-12 ENCOUNTER — Other Ambulatory Visit: Payer: Self-pay

## 2019-08-12 ENCOUNTER — Ambulatory Visit (INDEPENDENT_AMBULATORY_CARE_PROVIDER_SITE_OTHER): Payer: Self-pay | Admitting: Neurology

## 2019-08-12 DIAGNOSIS — Z0289 Encounter for other administrative examinations: Secondary | ICD-10-CM

## 2019-08-13 DIAGNOSIS — G4700 Insomnia, unspecified: Secondary | ICD-10-CM

## 2019-08-13 HISTORY — DX: Insomnia, unspecified: G47.00

## 2019-09-04 ENCOUNTER — Other Ambulatory Visit: Payer: Self-pay | Admitting: Neurology

## 2019-09-06 ENCOUNTER — Telehealth: Payer: Self-pay

## 2019-09-06 NOTE — Telephone Encounter (Signed)
Called patient to check on her with Inspire. She is doing weel. She turned her device up to 4 and it felt like her tongue was twisting. She turned back down to 3. I told her to keep trying. She would get used to it in time. She will call me if she has any questions.

## 2019-10-26 ENCOUNTER — Other Ambulatory Visit: Payer: Self-pay | Admitting: Neurology

## 2019-11-29 ENCOUNTER — Ambulatory Visit (INDEPENDENT_AMBULATORY_CARE_PROVIDER_SITE_OTHER): Payer: Medicare Other | Admitting: Neurology

## 2019-11-29 ENCOUNTER — Ambulatory Visit: Payer: Medicare Other | Admitting: Neurology

## 2019-11-29 DIAGNOSIS — Z789 Other specified health status: Secondary | ICD-10-CM

## 2019-11-29 DIAGNOSIS — G2581 Restless legs syndrome: Secondary | ICD-10-CM

## 2019-11-29 DIAGNOSIS — E1142 Type 2 diabetes mellitus with diabetic polyneuropathy: Secondary | ICD-10-CM

## 2019-11-29 DIAGNOSIS — J449 Chronic obstructive pulmonary disease, unspecified: Secondary | ICD-10-CM

## 2019-11-29 DIAGNOSIS — I739 Peripheral vascular disease, unspecified: Secondary | ICD-10-CM

## 2019-11-29 DIAGNOSIS — J4489 Other specified chronic obstructive pulmonary disease: Secondary | ICD-10-CM

## 2019-11-29 DIAGNOSIS — G4733 Obstructive sleep apnea (adult) (pediatric): Secondary | ICD-10-CM

## 2019-11-29 DIAGNOSIS — I6523 Occlusion and stenosis of bilateral carotid arteries: Secondary | ICD-10-CM

## 2019-11-30 ENCOUNTER — Telehealth: Payer: Self-pay | Admitting: Neurology

## 2019-11-30 NOTE — Telephone Encounter (Signed)
Called the patient to discuss and find more information. There was no answer. LVM asking the patient to call back. Asked the patient to leave a more detailed message (if I am unable to take call) discussing more in detail about her concerns she is having. In the event I am unable to assist I will be able to bring her concern to the sleep lab manager, who can contact inspire rep for the patient.

## 2019-11-30 NOTE — Telephone Encounter (Signed)
Pt has called to report an issue she is having with her Lori Byrd devise after her sleep study last night, please call.

## 2019-11-30 NOTE — Telephone Encounter (Signed)
Pt returned call and was stating she didn't know how to turn the inspire back on. Advised the patient that I am not familiar with the inspire set up and Shirlean Mylar was available to take the call in the sleep lab to assist.

## 2019-12-06 ENCOUNTER — Other Ambulatory Visit: Payer: Self-pay | Admitting: Neurology

## 2019-12-06 DIAGNOSIS — J449 Chronic obstructive pulmonary disease, unspecified: Secondary | ICD-10-CM

## 2019-12-06 DIAGNOSIS — Z789 Other specified health status: Secondary | ICD-10-CM

## 2019-12-06 DIAGNOSIS — E1142 Type 2 diabetes mellitus with diabetic polyneuropathy: Secondary | ICD-10-CM

## 2019-12-06 DIAGNOSIS — I6523 Occlusion and stenosis of bilateral carotid arteries: Secondary | ICD-10-CM

## 2019-12-06 DIAGNOSIS — I739 Peripheral vascular disease, unspecified: Secondary | ICD-10-CM

## 2019-12-06 DIAGNOSIS — G2581 Restless legs syndrome: Secondary | ICD-10-CM

## 2019-12-06 NOTE — Progress Notes (Signed)
Inspire titration: DIAGNOSIS  1. Obstructive Sleep Apnea/ hypopnea improved under inspire  Voltage of 1.6- 1.8 V but PLMs became more dominant. Apnea was  improved but not completely alleviated.  2. Snoring remained loud but related arousals were reduced.  3. Sleep Related Hypoxemia remained present through the study,  improved gradually under 1.7 V.  4. PMS were causing repeated, cyclic arousals.    PLANS/RECOMMENDATIONS: The device was reprogrammed to an incoming  amplitude of 1.6 V and lower limit was set 0.2V below the highest  titrated level. The upper limit was set 1 V above the lower  limit.

## 2019-12-06 NOTE — Procedures (Signed)
PATIENT'S NAME:  Lori Byrd, Lori Byrd DOB:      01-09-1951      MR#:    [id number]     DATE OF RECORDING: 11/29/2019 REFERRING M.D.:  Jerrell Belfast, MD Study Performed:   Inspire -Titration HISTORY:  Pt in the sleep lab for Inspire device fine tuning.    The patient endorsed the Epworth Sleepiness Scale at 2 points.    The patient's weight 184 pounds with a height of 62 (inches), resulting in a BMI of 33.7 kg/m2.  The patient's neck circumference measured 15 inches.  CURRENT MEDICATIONS: Fosamax, Elavil, Plavix, Allegra, Flonase, Metformin, Protonix, Desyrel, Effexor    PROCEDURE:  This is a multichannel digital polysomnogram utilizing the SomnoStar 11.2 system.  Electrodes and sensors were applied and monitored per AASM Specifications.   EEG, EOG, Chin and Limb EMG, were sampled at 200 Hz.  ECG, Snore and Nasal Pressure, Thermal Airflow, Respiratory Effort, CPAP Flow and Pressure, Oximetry was sampled at 50 Hz. Digital video and audio were recorded.       Hypoglossal nerve stimulator was implanted on 06-30-2019, activated on 08-10-2019. Amplitude had been set to 1.2 V.  Attended overnight stimulation was initiated at 0.2 V below the incoming amplitude= 1 V. titration was step by step increased until reaching 1.8 V because of hypopneas, apneas and desaturations.  At an inspire Voltage of  of 1.7 V, there was a reduction of the AHI to 5.5/h with improvement of sleep apnea.  Lights Out was at 21:18 and Lights On at 04:59. Total recording time (TRT) was 410.5 minutes, with a total sleep time (TST) of 333 minutes. The patient's sleep latency was 18.5 minutes. REM latency was 0 minutes.  The sleep efficiency was 81.1 %.    SLEEP ARCHITECTURE: WASO (Wake after sleep onset) was 92 minutes.  There were 87.5 minutes in Stage N1, 116.5 minutes Stage N2, 160 minutes Stage N3 and 0 minutes in Stage REM.  The percentage of Stage N1 was 24.%, Stage N2 was 32.%, Stage N3 was 44.% and Stage R (REM sleep) was 0%.    RESPIRATORY ANALYSIS:  There was a total of 43 respiratory events: 0 obstructive apneas, 1 central apnea and 1 mixed apnea with 41 hypopneas with a hypopnea index of 7.4/hour.   The total APNEA/HYPOPNEA INDEX (AHI) was 7.7 /hour. 0 events occurred in REM sleep and 43 events in NREM. The REM AHI was 0.0 /hour versus a non-REM AHI of 7.7 /hour.  The patient spent 0 minutes of total sleep time in the supine position and 364 minutes in non-supine.   OXYGEN SATURATION & C02:  The baseline 02 saturation was 87%, with the lowest being 81%. Time spent below 89% saturation equaled 100 minutes.  PERIODIC LIMB MOVEMENTS:  The patient had a total of 253 Periodic Limb Movements. The Periodic Limb Movement (PLM) index was 45.6 and the PLM Arousal index was 2.9 /hour. The arousals were noted as: 56 were spontaneous, 16 were associated with PLMs, 12 were associated with respiratory events. Loud Snoring was still noted. EKG was in keeping with normal sinus rhythm (NSR).   DIAGNOSIS 1. Obstructive Sleep Apnea/ hypopnea improved under inspire Voltage of 1.6- 1.8 V but PLMs became more dominant. Apnea was improved but not completely alleviated.  2. Snoring remained loud but related arousals were reduced.  3. Sleep Related Hypoxemia remained present through the study, improved gradually under 1.7 V.  4. PMS were causing repeated, cyclic arousals.    PLANS/RECOMMENDATIONS: The device was  reprogrammed to an incoming amplitude of 1.6 V and lower limit was set 0.2V below the highest titrated level. The upper limit was set 1 V above the lower limit.    DISCUSSION: The patient's PLMs we will require additional medication to be controlled.  Apnea and snoring were improved under the last setting.    A follow up appointment will be scheduled in the Sleep Clinic at [facility].   Please call [phone] with any questions.     I certify that I have reviewed the entire raw data recording prior to  the issuance of this report in accordance with the Standards of Accreditation of the American Academy of Sleep Medicine (AASM)     Larey Seat, M.D.  Diplomat, Tax adviser of Psychiatry and Neurology  Diplomat, Tax adviser of Sleep Medicine Market researcher, [employer] [facility] at [employer]

## 2019-12-07 ENCOUNTER — Telehealth: Payer: Self-pay | Admitting: Neurology

## 2019-12-07 NOTE — Telephone Encounter (Signed)
-----   Message from Larey Seat, MD sent at 12/06/2019 12:32 PM EDT ----- Lori Byrd titration: DIAGNOSIS  1. Obstructive Sleep Apnea/ hypopnea improved under inspire  Voltage of 1.6- 1.8 V but PLMs became more dominant. Apnea was  improved but not completely alleviated.  2. Snoring remained loud but related arousals were reduced.  3. Sleep Related Hypoxemia remained present through the study,  improved gradually under 1.7 V.  4. PMS were causing repeated, cyclic arousals.    PLANS/RECOMMENDATIONS: The device was reprogrammed to an incoming  amplitude of 1.6 V and lower limit was set 0.2V below the highest  titrated level. The upper limit was set 1 V above the lower  limit.

## 2019-12-07 NOTE — Telephone Encounter (Signed)
Called the patient to discuss the inspire titration study. Advised that we found that the inspire device worked best 1.6-1.8 V. Advised that we will address the titration and study in more detail June 2. Pt verbalized understanding. Pt had no questions at this time but was encouraged to call back if questions arise.

## 2019-12-22 ENCOUNTER — Other Ambulatory Visit: Payer: Self-pay

## 2019-12-22 ENCOUNTER — Ambulatory Visit (INDEPENDENT_AMBULATORY_CARE_PROVIDER_SITE_OTHER): Payer: Medicare Other | Admitting: Neurology

## 2019-12-22 ENCOUNTER — Encounter: Payer: Self-pay | Admitting: Neurology

## 2019-12-22 VITALS — BP 155/77 | HR 87 | Ht 61.0 in | Wt 190.5 lb

## 2019-12-22 DIAGNOSIS — I6523 Occlusion and stenosis of bilateral carotid arteries: Secondary | ICD-10-CM | POA: Diagnosis not present

## 2019-12-22 DIAGNOSIS — J449 Chronic obstructive pulmonary disease, unspecified: Secondary | ICD-10-CM | POA: Diagnosis not present

## 2019-12-22 DIAGNOSIS — G4733 Obstructive sleep apnea (adult) (pediatric): Secondary | ICD-10-CM | POA: Diagnosis not present

## 2019-12-22 DIAGNOSIS — E1142 Type 2 diabetes mellitus with diabetic polyneuropathy: Secondary | ICD-10-CM | POA: Diagnosis not present

## 2019-12-22 NOTE — Patient Instructions (Signed)
Please download Inspire sleep on your phone.   This way all INSPIRE  titration steps and effect can be communicated easier.    GOAL is for now to reach 1.7 Volt.
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
## 2019-12-29 ENCOUNTER — Encounter: Payer: Self-pay | Admitting: Neurology

## 2020-01-04 ENCOUNTER — Telehealth: Payer: Self-pay

## 2020-01-04 NOTE — Telephone Encounter (Signed)
ONO result received. Placed in result bin for MD to review.

## 2020-01-06 NOTE — Telephone Encounter (Signed)
Pt returned call and I reviewed and explained everything with her. Pt verbalized understanding and will follow up with her PCP in aug

## 2020-01-06 NOTE — Telephone Encounter (Signed)
I have placed a copy of the ONO report in the mail so that she will have the information as well for her PCP.

## 2020-01-06 NOTE — Telephone Encounter (Signed)
Called the patient and there was no answer. LVM informing the patient I would send a mychart message with her ONO result. Advised she could also return the call to review.   **If patient returns call please advise that the overnight oximetry was reviewed by Dr Brett Fairy. On the current inspire setting the patient still had evidence of oxygen level dropping. She is recommending that the patient follow up with Primary care to assess if oxygen can be ordered for the patient. Unfortunately as neurology we are unable to have insurance approve oxygen and this must be addressed by PCP and or possibly even a referral to pulmonology.

## 2020-03-02 DIAGNOSIS — M48061 Spinal stenosis, lumbar region without neurogenic claudication: Secondary | ICD-10-CM

## 2020-03-02 DIAGNOSIS — I739 Peripheral vascular disease, unspecified: Secondary | ICD-10-CM

## 2020-03-02 DIAGNOSIS — M797 Fibromyalgia: Secondary | ICD-10-CM

## 2020-03-02 HISTORY — DX: Peripheral vascular disease, unspecified: I73.9

## 2020-03-02 HISTORY — DX: Fibromyalgia: M79.7

## 2020-03-02 HISTORY — DX: Spinal stenosis, lumbar region without neurogenic claudication: M48.061

## 2020-03-06 ENCOUNTER — Telehealth: Payer: Self-pay | Admitting: Nurse Practitioner

## 2020-03-06 ENCOUNTER — Other Ambulatory Visit: Payer: Self-pay | Admitting: Specialist

## 2020-03-06 DIAGNOSIS — G8929 Other chronic pain: Secondary | ICD-10-CM

## 2020-03-06 NOTE — Telephone Encounter (Signed)
Phone call to patient to verify medication list and allergies for myelogram procedure. Pt instructed to hold buspar and effexor for 48hrs prior to myelogram appointment time. Pt verbalized understanding. Pre and post procedure instructions reviewed with pt. 

## 2020-03-09 ENCOUNTER — Other Ambulatory Visit: Payer: Self-pay

## 2020-03-09 ENCOUNTER — Ambulatory Visit
Admission: RE | Admit: 2020-03-09 | Discharge: 2020-03-09 | Disposition: A | Discharge status: Home / Self Care | Payer: Medicare Other | Source: Ambulatory Visit | Attending: Specialist | Admitting: Specialist

## 2020-03-09 DIAGNOSIS — G8929 Other chronic pain: Secondary | ICD-10-CM

## 2020-03-09 DIAGNOSIS — M545 Low back pain, unspecified: Secondary | ICD-10-CM

## 2020-03-09 MED ORDER — DIAZEPAM 5 MG PO TABS
5.0000 mg | ORAL_TABLET | Freq: Once | ORAL | Status: AC
  Administered 2020-03-09: 5 mg via ORAL

## 2020-03-09 MED ORDER — IOPAMIDOL (ISOVUE-M 200) INJECTION 41%
18.0000 mL | Freq: Once | INTRAMUSCULAR | Status: AC
  Administered 2020-03-09: 18 mL via INTRATHECAL

## 2020-03-09 NOTE — Discharge Instructions (Signed)
Myelogram Discharge Instructions  1. Go home and rest quietly for the next 24 hours.  It is important to lie flat for the next 24 hours.  Get up only to go to the restroom.  You may lie in the bed or on a couch on your back, your stomach, your left side or your right side.  You may have one pillow under your head.  You may have pillows between your knees while you are on your side or under your knees while you are on your back.  2. DO NOT drive today.  Recline the seat as far back as it will go, while still wearing your seat belt, on the way home.  3. You may get up to go to the bathroom as needed.  You may sit up for 10 minutes to eat.  You may resume your normal diet and medications unless otherwise indicated.  Drink plenty of extra fluids today and tomorrow.  4. The incidence of a spinal headache with nausea and/or vomiting is about 5% (one in 20 patients).  If you develop a headache, lie flat and drink plenty of fluids until the headache goes away.  Caffeinated beverages may be helpful.  If you develop severe nausea and vomiting or a headache that does not go away with flat bed rest, call [phone].  5. You may resume normal activities after your 24 hours of bed rest is over; however, do not exert yourself strongly or do any heavy lifting tomorrow.  6. Call your physician for a follow-up appointment.    You may resume Plavix today.  You may resume Buspar and Effexor on Friday, March 10, 2020, after 9:30a.m.

## 2020-03-09 NOTE — Progress Notes (Signed)
Patient states she has been off Buspar and Effexor for at least the past two days.

## 2020-03-10 NOTE — Progress Notes (Signed)
Pt called into the nurses line today with compliants of nausea, vomiting, and diarrhea since her Myelogram procedure. Pt had a myelogram at 0930 on 03/09/20. Pt denies headaches or fever at this time. After speaking with Dr. Jobe Igo, the radiologist here today, he stated he feels that her symptoms are not related to the myelogram procedure and she should reach out to hear primary care doctor.  Pt was made aware of this and stated she would call her PCP.

## 2020-04-01 ENCOUNTER — Other Ambulatory Visit: Payer: Self-pay | Admitting: Nurse Practitioner

## 2020-04-01 DIAGNOSIS — E113293 Type 2 diabetes mellitus with mild nonproliferative diabetic retinopathy without macular edema, bilateral: Secondary | ICD-10-CM

## 2020-04-01 DIAGNOSIS — I10 Essential (primary) hypertension: Secondary | ICD-10-CM

## 2020-04-01 DIAGNOSIS — U071 COVID-19: Secondary | ICD-10-CM

## 2020-04-01 DIAGNOSIS — J449 Chronic obstructive pulmonary disease, unspecified: Secondary | ICD-10-CM

## 2020-04-01 NOTE — Progress Notes (Signed)
I connected by phone with Lori Byrd on 04/01/2020 at 6:42 PM to discuss the potential use of a new treatment for mild to moderate COVID-19 viral infection in non-hospitalized patients.  This patient is a 69 y.o. female that meets the FDA criteria for Emergency Use Authorization of COVID monoclonal antibody casirivimab/imdevimab.  Has a (+) direct SARS-CoV-2 viral test result  Has mild or moderate COVID-19   Is NOT hospitalized due to COVID-19  Is within 10 days of symptom onset  Has at least one of the high risk factor(s) for progression to severe COVID-19 and/or hospitalization as defined in EUA.  Specific high risk criteria : Older age (>/= 69 yo), BMI > 25, Diabetes, Cardiovascular disease or hypertension and Chronic Lung Disease   I have spoken and communicated the following to the patient or parent/caregiver regarding COVID monoclonal antibody treatment:  1. FDA has authorized the emergency use for the treatment of mild to moderate COVID-19 in adults and pediatric patients with positive results of direct SARS-CoV-2 viral testing who are 29 years of age and older weighing at least 40 kg, and who are at high risk for progressing to severe COVID-19 and/or hospitalization.  2. The significant known and potential risks and benefits of COVID monoclonal antibody, and the extent to which such potential risks and benefits are unknown.  3. Information on available alternative treatments and the risks and benefits of those alternatives, including clinical trials.  4. Patients treated with COVID monoclonal antibody should continue to self-isolate and use infection control measures (e.g., wear mask, isolate, social distance, avoid sharing personal items, clean and disinfect "high touch" surfaces, and frequent handwashing) according to CDC guidelines.   5. The patient or parent/caregiver has the option to accept or refuse COVID monoclonal antibody treatment.  After reviewing this  information with the patient, The patient agreed to proceed with receiving casirivimab\imdevimab infusion and will be provided a copy of the Fact sheet prior to receiving the infusion.  Sx onset 03/30/20. Set up for infusion on Monday 04/03/20 at 8:30 am. Directions given to [facility]. Pt is aware that insurance will be charged an infusion fee.   [name] 04/01/2020 6:42 PM

## 2020-04-02 ENCOUNTER — Telehealth (HOSPITAL_COMMUNITY): Payer: Self-pay | Admitting: Oncology

## 2020-04-02 NOTE — Telephone Encounter (Signed)
Re: [facility] Appt.   Directions given.   Faythe Casa, NP 04/02/2020 1:56 PM

## 2020-04-03 ENCOUNTER — Other Ambulatory Visit (HOSPITAL_COMMUNITY): Payer: Self-pay

## 2020-04-03 ENCOUNTER — Ambulatory Visit (HOSPITAL_COMMUNITY)
Admission: RE | Admit: 2020-04-03 | Discharge: 2020-04-03 | Disposition: A | Discharge status: Home / Self Care | Payer: Medicare Other | Source: Ambulatory Visit | Attending: Pulmonary Disease | Admitting: Pulmonary Disease

## 2020-04-03 DIAGNOSIS — I10 Essential (primary) hypertension: Secondary | ICD-10-CM

## 2020-04-03 DIAGNOSIS — E113293 Type 2 diabetes mellitus with mild nonproliferative diabetic retinopathy without macular edema, bilateral: Secondary | ICD-10-CM | POA: Diagnosis not present

## 2020-04-03 DIAGNOSIS — U071 COVID-19: Secondary | ICD-10-CM | POA: Diagnosis present

## 2020-04-03 DIAGNOSIS — J4489 Other specified chronic obstructive pulmonary disease: Secondary | ICD-10-CM

## 2020-04-03 DIAGNOSIS — Z23 Encounter for immunization: Secondary | ICD-10-CM | POA: Diagnosis not present

## 2020-04-03 DIAGNOSIS — J449 Chronic obstructive pulmonary disease, unspecified: Secondary | ICD-10-CM | POA: Diagnosis not present

## 2020-04-03 MED ORDER — EPINEPHRINE 0.3 MG/0.3ML IJ SOAJ: 0.3000 mg | Freq: Once | INTRAMUSCULAR | Status: DC | PRN

## 2020-04-03 MED ORDER — DIPHENHYDRAMINE HCL 50 MG/ML IJ SOLN: 50.0000 mg | Freq: Once | INTRAMUSCULAR | Status: DC | PRN

## 2020-04-03 MED ORDER — METHYLPREDNISOLONE SODIUM SUCC 125 MG IJ SOLR: 125.0000 mg | Freq: Once | INTRAMUSCULAR | Status: DC | PRN

## 2020-04-03 MED ORDER — ALBUTEROL SULFATE HFA 108 (90 BASE) MCG/ACT IN AERS: 2.0000 | INHALATION_SPRAY | Freq: Once | RESPIRATORY_TRACT | Status: DC | PRN

## 2020-04-03 MED ORDER — FAMOTIDINE IN NACL 20-0.9 MG/50ML-% IV SOLN: 20.0000 mg | Freq: Once | INTRAVENOUS | Status: DC | PRN

## 2020-04-03 MED ORDER — SODIUM CHLORIDE 0.9 % IV SOLN
1200.0000 mg | Freq: Once | INTRAVENOUS | Status: AC
  Administered 2020-04-03: 1200 mg via INTRAVENOUS
  Filled 2020-04-03: qty 10

## 2020-04-03 MED ORDER — SODIUM CHLORIDE 0.9 % IV SOLN: INTRAVENOUS | Status: DC | PRN

## 2020-04-03 NOTE — Discharge Instructions (Signed)

## 2020-04-03 NOTE — Progress Notes (Signed)
  Diagnosis: COVID-19  Physician:Dr Joya Gaskins  Procedure: [facility] Med: casirivimab\imdevimab infusion - Provided patient with casirivimab\imdevimab fact sheet for patients, parents and caregivers prior to infusion.  Complications: No immediate complications noted.  Discharge: Discharged home   Robert Lee, [place] 04/03/2020

## 2020-06-19 DIAGNOSIS — M4310 Spondylolisthesis, site unspecified: Secondary | ICD-10-CM

## 2020-06-19 HISTORY — DX: Spondylolisthesis, site unspecified: M43.10

## 2020-06-22 ENCOUNTER — Ambulatory Visit: Payer: Medicare Other | Admitting: Neurology

## 2020-06-22 ENCOUNTER — Ambulatory Visit (INDEPENDENT_AMBULATORY_CARE_PROVIDER_SITE_OTHER): Payer: Medicare Other | Admitting: Neurology

## 2020-06-22 ENCOUNTER — Encounter: Payer: Self-pay | Admitting: Neurology

## 2020-06-22 VITALS — BP 128/76 | HR 89 | Ht 60.0 in | Wt 193.0 lb

## 2020-06-22 DIAGNOSIS — Z789 Other specified health status: Secondary | ICD-10-CM | POA: Diagnosis not present

## 2020-06-22 DIAGNOSIS — Z9682 Presence of neurostimulator: Secondary | ICD-10-CM

## 2020-06-22 DIAGNOSIS — G4734 Idiopathic sleep related nonobstructive alveolar hypoventilation: Secondary | ICD-10-CM

## 2020-06-22 DIAGNOSIS — G4733 Obstructive sleep apnea (adult) (pediatric): Secondary | ICD-10-CM

## 2020-06-22 HISTORY — DX: Obstructive sleep apnea (adult) (pediatric): G47.33

## 2020-06-22 HISTORY — DX: Presence of neurostimulator: Z96.82

## 2020-06-22 NOTE — Patient Instructions (Signed)
Home Oxygen Use, Adult When a medical condition keeps you from getting enough oxygen, your health care provider may instruct you to take extra oxygen at home. Your health care provider will let you know:  When to take oxygen.  For how long to take oxygen.  How quickly oxygen should be delivered (flow rate), in liters per minute (LPM or L/M). Home oxygen can be given through:  A mask.  A nasal cannula. This is a device or tube that goes in the nostrils.  A transtracheal catheter. This is a small, flexible tube placed in the trachea.  A tracheostomy. This is a surgically made opening in the trachea. These devices are connected with tubing to an oxygen source, such as:  A tank. Tanks hold oxygen in gas form. They must be replaced when the oxygen is used up.  A liquid oxygen device. This holds oxygen in liquid form. It must be replaced when the oxygen is used up.  An oxygen concentrator machine. This filters oxygen in the room. It uses electricity, so you must have a backup cylinder of oxygen in case the power goes out. Supplies needed: To use oxygen, you will need:  A mask, nasal cannula, transtracheal catheter, or tracheostomy.  An oxygen tank, a liquid oxygen device, or an oxygen concentrator.  The tape that your health care provider recommends (optional). If you use a transtracheal catheter and your prescribed flow rate is 1 LPM or greater, you will also need a humidifier. Risks and complications  Fire. This can happen if the oxygen is exposed to a heat source, flame, or spark.  Injury to skin. This can happen if liquid oxygen touches your skin.  Organ damage. This can happen if you get too little oxygen. How to use oxygen Your health care provider or a representative from your medical device company will show you how to use your oxygen device. Follow her or his instructions. The instructions may look something like this: 1. Wash your hands. 2. If you use an oxygen  concentrator, make sure it is plugged in. 3. Place one end of the tube into the port on the tank, device, or machine. 4. Place the mask over your nose and mouth. Or, place the nasal cannula and secure it with tape if instructed. If you use a tracheostomy or transtracheal catheter, connect it to the oxygen source as directed. 5. Make sure the liter-flow setting on the machine is at the level prescribed by your health care provider. 6. Turn on the machine or adjust the knob on the tank or device to the correct liter-flow setting. 7. When you are done, turn off and unplug the machine, or turn the knob to OFF. How to clean and care for the oxygen supplies Nasal cannula  Clean it with a warm, wet cloth daily or as needed.  Wash it with a liquid soap once a week.  Rinse it thoroughly once or twice a week.  Replace it every 2-4 weeks.  If you have an infection, such as a cold or pneumonia, change the cannula when you get better. Mask  Replace it every 2-4 weeks.  If you have an infection, such as a cold or pneumonia, change the mask when you get better. Humidifier bottle  Wash the bottle between each refill: ? Wash it with soap and warm water. ? Rinse it thoroughly. ? Disinfect it and its top. ? Air-dry it.  Make sure it is dry before you refill it. Oxygen concentrator  Clean   the air filter at least twice a week according to directions from your home medical equipment and service company.  Wipe down the cabinet every day. To do this: ? Unplug the unit. ? Wipe down the cabinet with a damp cloth. ? Dry the cabinet. Other equipment  Change any extra tubing every 1-3 months.  Follow instructions from your health care provider about taking care of any other equipment. Safety tips Fire safety tips   Keep your oxygen and oxygen supplies at least 5 ft away from sources of heat, flames, and sparks at all times.  Do not allow smoking near your oxygen. Put up "no smoking" signs in  your home. Avoid smoking areas when in public.  Do not use materials that can burn (are flammable) while you use oxygen.  When you go to a restaurant with portable oxygen, ask to be seated in the nonsmoking section.  Keep a fire extinguisher close by. Let your fire department know that you have oxygen in your home.  Test your home smoke detectors regularly. Traveling  Secure your oxygen tank in the vehicle so that it does not move around. Follow instructions from your medical device company about how to safely secure your tank.  Make sure you have enough oxygen for the amount of time you will be away from home.  If you are planning air travel, contact the airline to find out if they allow the use of an approved portable oxygen concentrator. You may also need documents from your health care provider and medical device company before you travel. General safety tips  If you use an oxygen cylinder, make sure it is in a stand or secured to an object that will not move (fixed object).  If you use liquid oxygen, make sure its container is kept upright.  If you use an oxygen concentrator: ? Tell your electric company. Make sure you are given priority service in the event that your power goes out. ? Avoid using extension cords, if possible. Follow these instructions at home:  Use oxygen only as told by your health care provider.  Do not use alcohol or other drugs that make you relax (sedating drugs) unless instructed. They can slow down your breathing rate and make it hard to get in enough oxygen.  Know how and when to order a refill of oxygen.  Always keep a spare tank of oxygen. Plan ahead for holidays when you may not be able to get a prescription filled.  Use water-based lubricants on your lips or nostrils. Do not use oil-based products like petroleum jelly.  To prevent skin irritation on your cheeks or behind your ears, tuck some gauze under the tubing. Contact a health care  provider if:  You get headaches often.  You have shortness of breath.  You have a lasting cough.  You have anxiety.  You are sleepy all the time.  You develop an illness that affects your breathing.  You cannot exercise at your regular level.  You are restless.  You have difficult or irregular breathing, and it is getting worse.  You have a fever.  You have persistent redness under your nose. Get help right away if:  You are confused.  You have blue lips or fingernails.  You are struggling to breathe. Summary  Your health care provider or a representative from your medical device company will show you how to use your oxygen device. Follow her or his instructions.  If you use an oxygen concentrator, make   sure it is plugged in.  Make sure the liter-flow setting on the machine is at the level prescribed by your health care provider.  Keep your oxygen and oxygen supplies at least 5 ft away from sources of heat, flames, and sparks at all times. This information is not intended to replace advice given to you by your health care provider. Make sure you discuss any questions you have with your health care provider. Document Revised: 12/25/2017 Document Reviewed: 01/30/2016 Elsevier Patient Education  2020 Elsevier Inc.  
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
## 2020-07-04 ENCOUNTER — Telehealth: Payer: Self-pay

## 2020-07-04 NOTE — Telephone Encounter (Signed)
Called patient to see what level she is on with Inspire. She is at level 3. Based on her overnight oximetry her oxygen levels are dropping. She needs to be at level 5 which is her theraputic settings for her apnea. I encouraged her to increase to level 4 for a week, then increase to level 5. I will check on her in two weeks to see how she is doing.

## 2020-07-06 NOTE — Telephone Encounter (Signed)
Thank you, CD  We repeat an ONO or sleep test at the goal level?

## 2020-07-25 ENCOUNTER — Telehealth: Payer: Self-pay

## 2020-07-25 DIAGNOSIS — I739 Peripheral vascular disease, unspecified: Secondary | ICD-10-CM

## 2020-07-25 DIAGNOSIS — E1142 Type 2 diabetes mellitus with diabetic polyneuropathy: Secondary | ICD-10-CM

## 2020-07-25 DIAGNOSIS — J449 Chronic obstructive pulmonary disease, unspecified: Secondary | ICD-10-CM

## 2020-07-25 DIAGNOSIS — Z789 Other specified health status: Secondary | ICD-10-CM

## 2020-07-25 NOTE — Telephone Encounter (Signed)
Absolutely- we will ask her to come in at [facility] level 5 and will see about her oxygen needs-  if and how much oxygen.  We can titrate that night . cd

## 2020-07-25 NOTE — Telephone Encounter (Signed)
Called patient to see how she is doing on her Inspire therapy since we last talked. She is now at level 5 which is her theraputic setting. She is feeling better and has adjusted well. No more am headaches. Can we do a PSG to monitor her oxygen levels and make sure level 5 is still treating her apnea?

## 2020-08-04 IMAGING — DX DG CHEST 1V PORT
1 series · 1 of 1 positions shown · non-contrast
Comparison: 11/21/2018

CLINICAL DATA: Implantation of hypoglossal nerve stimulator.

EXAM:
PORTABLE CHEST 1 VIEW

[chest]
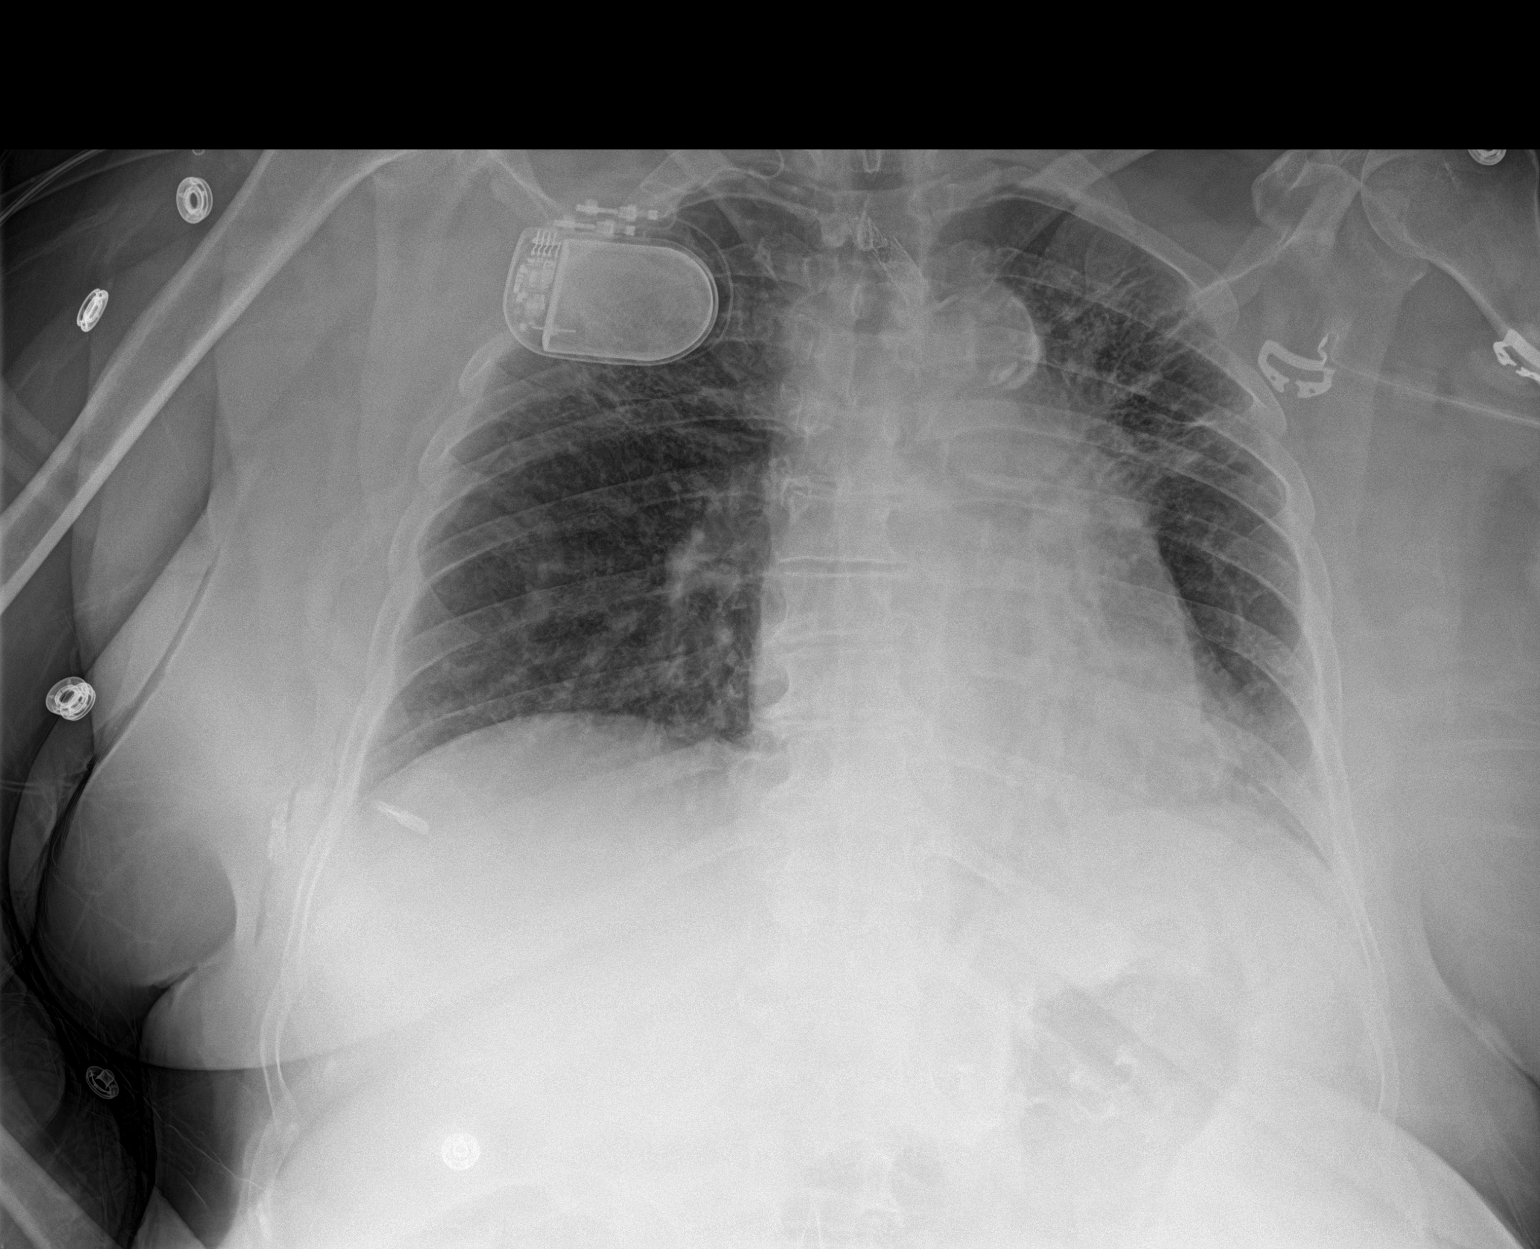

[1 of 1 positions shown; findings below may reference images not displayed]

FINDINGS: The cardiac silhouette, mediastinal and hilar contours are stable.
Stable tortuosity and calcification of the thoracic aorta. Right
brachiocephalic artery stent is noted.

Chronic scarring changes in both lungs but no acute pulmonary
findings. There is a nerve stimulator battery pack noted overlying
the right upper chest with a wire extending up in the submandibular
area.
IMPRESSION: 1. No acute cardiopulmonary findings. Chronic pulmonary scarring
changes in the upper lobes in particular.
2. Hypoglossal nerve stimulator noted.

## 2020-08-04 IMAGING — DX DG NECK SOFT TISSUE
1 series · 2 of 2 positions shown · non-contrast
Comparison: None.

CLINICAL DATA: Placement of hypoglossal nerve stimulator.

EXAM:
NECK SOFT TISSUES - 1+ VIEW

[Series 1: neck · 0.14mm/px · 2 of 2 slices shown]
[im 1/2]
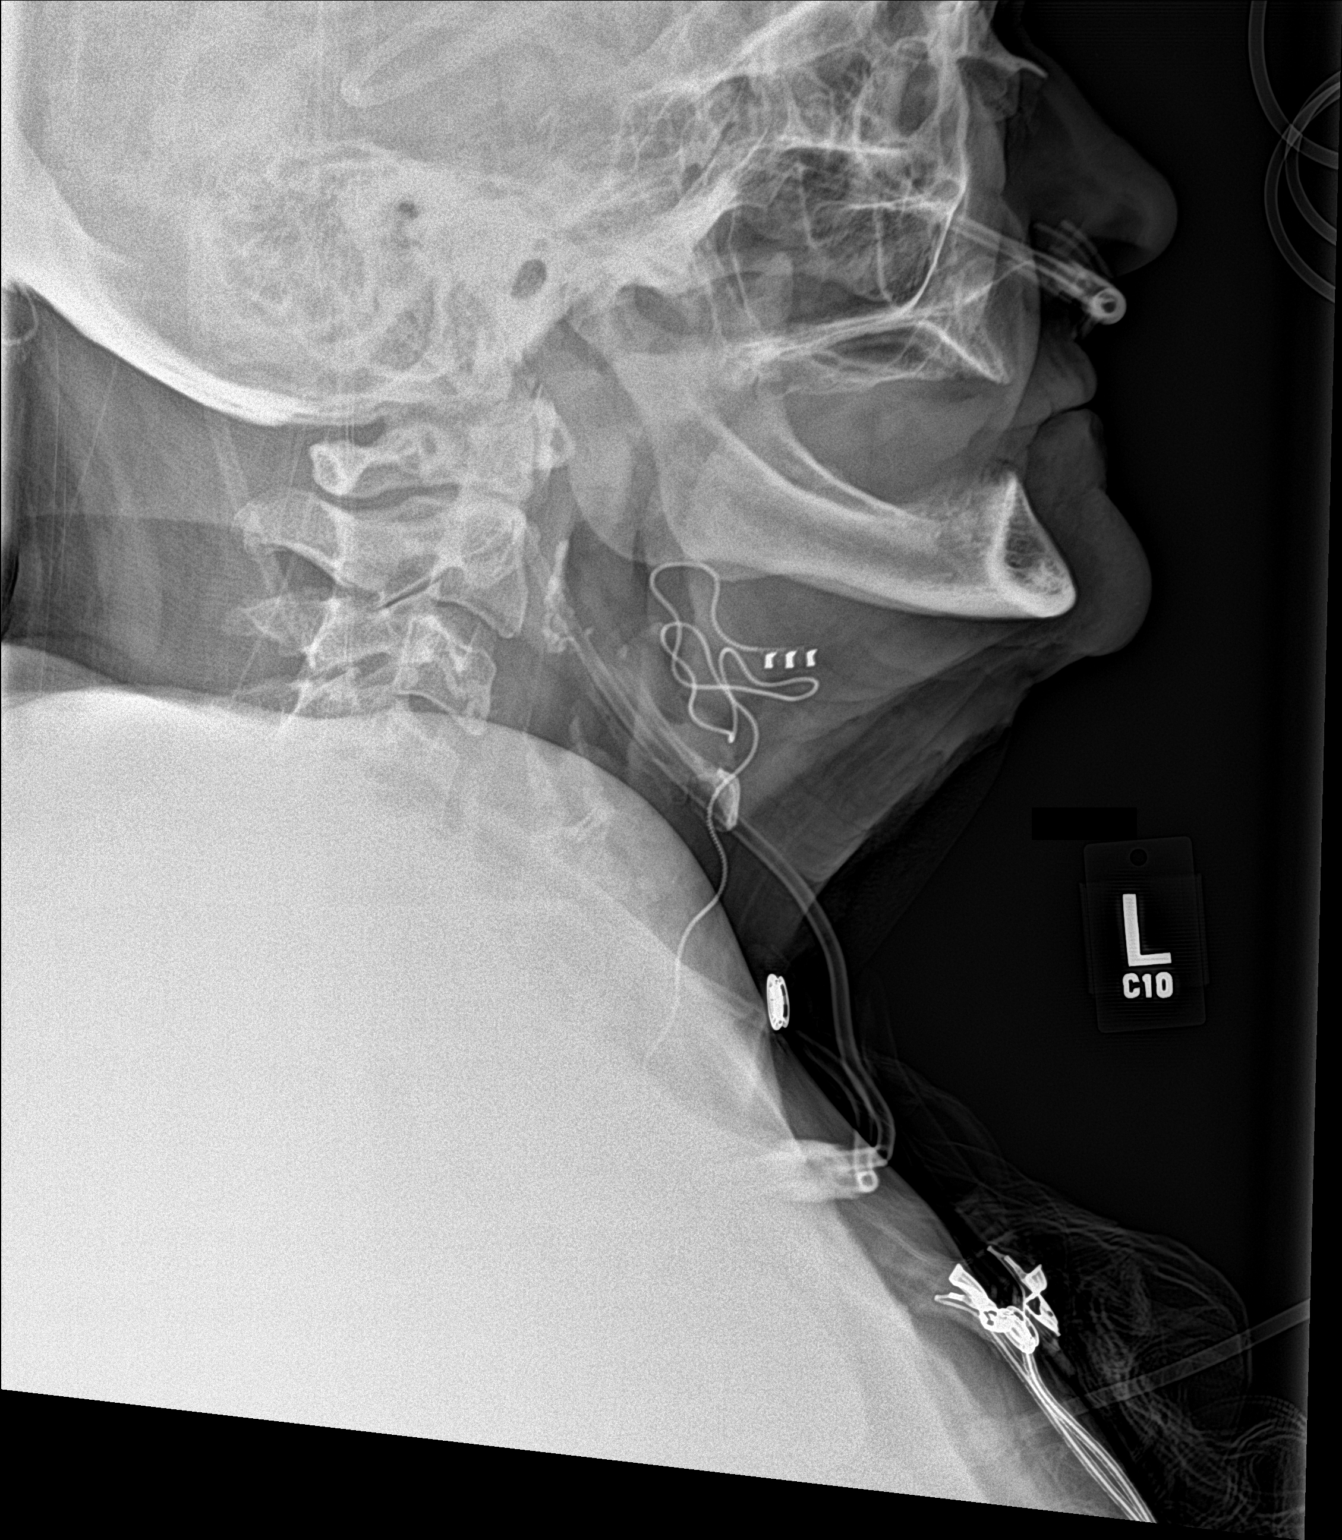
[im 2/2]
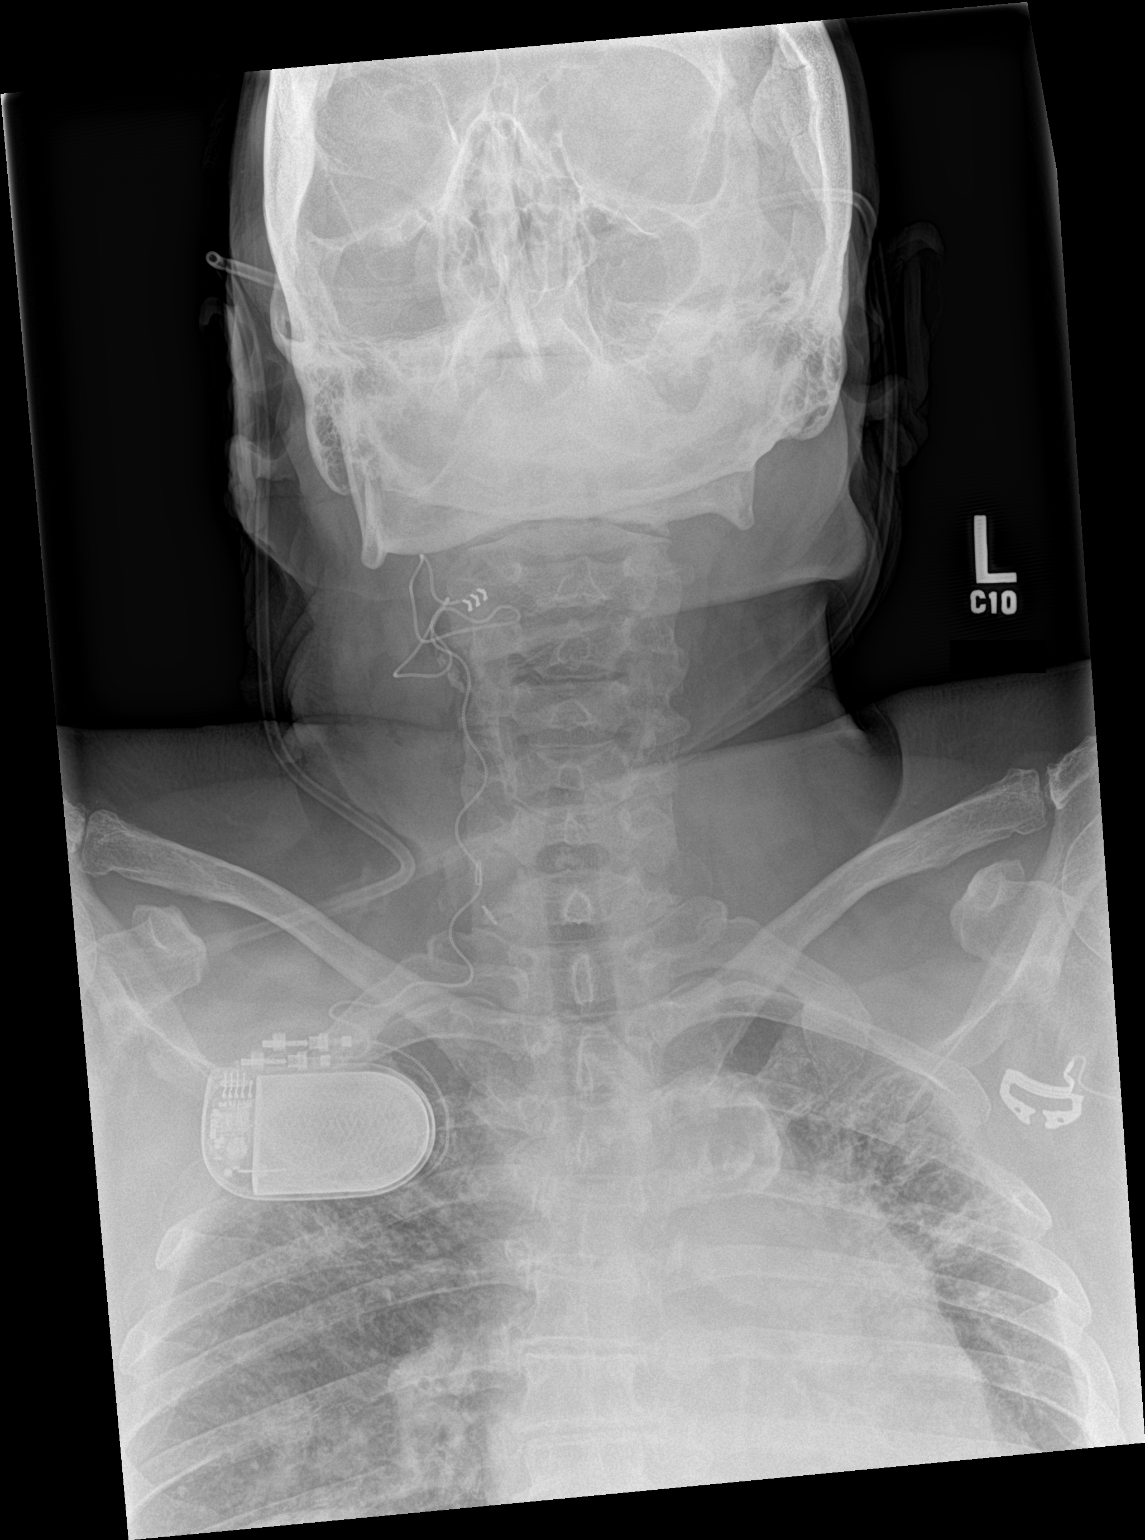

[2 of 2 positions shown; findings below may reference images not displayed]

FINDINGS: Coiled wires with nerve stimulator tip located in the submandibular
region.
IMPRESSION: Coiled wires and nerve stimulator tip in the submandibular region.

## 2020-09-04 ENCOUNTER — Other Ambulatory Visit: Payer: Self-pay | Admitting: Specialist

## 2020-09-04 DIAGNOSIS — M545 Low back pain, unspecified: Secondary | ICD-10-CM

## 2020-09-05 ENCOUNTER — Telehealth: Payer: Self-pay

## 2020-09-05 NOTE — Telephone Encounter (Signed)
Spoke with patient on 09-04-20 to see how she is doing pre sleep study. She is having severe back pain and needs a MRI. I spoke with Gerald Stabs from [facility] and he suggested she let her doctor know she cannot have MRI but can have cat scan. I called her back this morning and doctor ordered cat scan for this Saturday. I cancelled her sleep study for 09-11-20 and will call her back in two weeks to see what her spine doctor says. Will RS sleep study if she can do it.

## 2020-09-09 ENCOUNTER — Ambulatory Visit
Admission: RE | Admit: 2020-09-09 | Discharge: 2020-09-09 | Disposition: A | Discharge status: Home / Self Care | Payer: Medicare Other | Source: Ambulatory Visit | Attending: Specialist | Admitting: Specialist

## 2020-09-09 ENCOUNTER — Other Ambulatory Visit: Payer: Self-pay

## 2020-09-09 DIAGNOSIS — M545 Low back pain, unspecified: Secondary | ICD-10-CM

## 2020-09-18 ENCOUNTER — Telehealth: Payer: Self-pay

## 2020-09-18 NOTE — Telephone Encounter (Signed)
Any new appointment for sleep study?

## 2020-09-18 NOTE — Telephone Encounter (Signed)
Called patient to see how she is doing with her back. She is still in pain. She will see her doctor on March 22. I will call her back at end of March and see if she is better. Will not schedule sleep study until her pain is relieved.

## 2020-12-29 DIAGNOSIS — M5416 Radiculopathy, lumbar region: Secondary | ICD-10-CM | POA: Insufficient documentation

## 2020-12-29 HISTORY — DX: Radiculopathy, lumbar region: M54.16

## 2021-01-25 DIAGNOSIS — M47816 Spondylosis without myelopathy or radiculopathy, lumbar region: Secondary | ICD-10-CM | POA: Insufficient documentation

## 2021-01-25 HISTORY — DX: Spondylosis without myelopathy or radiculopathy, lumbar region: M47.816

## 2021-05-30 DIAGNOSIS — I2510 Atherosclerotic heart disease of native coronary artery without angina pectoris: Secondary | ICD-10-CM

## 2021-05-30 DIAGNOSIS — D6869 Other thrombophilia: Secondary | ICD-10-CM

## 2021-05-30 HISTORY — DX: Other thrombophilia: D68.69

## 2021-05-30 HISTORY — DX: Atherosclerotic heart disease of native coronary artery without angina pectoris: I25.10

## 2021-06-19 ENCOUNTER — Telehealth: Payer: Self-pay | Admitting: Neurology

## 2021-06-19 NOTE — Telephone Encounter (Signed)
Called pt. Scheduled f/u for 06/21/21 at 3pm w/ Dr. Brett Fairy. Advised it has been almost a year since seen and she will need appt to discuss treatment.

## 2021-06-19 NOTE — Telephone Encounter (Signed)
I informed Meagan in sleep lab that pt is scheduled.

## 2021-06-19 NOTE — Telephone Encounter (Signed)
Pt called, have got the inspire for sleep apnea. Would like a call back to discuss if need another sleep study to adjust the inspirer or do I adjust it myself.

## 2021-06-21 ENCOUNTER — Ambulatory Visit: Payer: 59 | Admitting: Neurology

## 2021-08-30 DIAGNOSIS — E538 Deficiency of other specified B group vitamins: Secondary | ICD-10-CM | POA: Diagnosis not present

## 2021-09-03 DIAGNOSIS — Z87891 Personal history of nicotine dependence: Secondary | ICD-10-CM | POA: Diagnosis not present

## 2021-09-03 DIAGNOSIS — Z95828 Presence of other vascular implants and grafts: Secondary | ICD-10-CM | POA: Diagnosis not present

## 2021-09-03 DIAGNOSIS — I739 Peripheral vascular disease, unspecified: Secondary | ICD-10-CM | POA: Diagnosis not present

## 2021-09-03 DIAGNOSIS — E1141 Type 2 diabetes mellitus with diabetic mononeuropathy: Secondary | ICD-10-CM | POA: Diagnosis not present

## 2021-09-03 DIAGNOSIS — I10 Essential (primary) hypertension: Secondary | ICD-10-CM | POA: Diagnosis not present

## 2021-09-03 DIAGNOSIS — I771 Stricture of artery: Secondary | ICD-10-CM | POA: Diagnosis not present

## 2021-09-03 DIAGNOSIS — I6523 Occlusion and stenosis of bilateral carotid arteries: Secondary | ICD-10-CM | POA: Diagnosis not present

## 2021-09-11 ENCOUNTER — Ambulatory Visit (INDEPENDENT_AMBULATORY_CARE_PROVIDER_SITE_OTHER): Payer: Medicare Other | Admitting: Neurology

## 2021-09-11 ENCOUNTER — Encounter: Payer: Self-pay | Admitting: Neurology

## 2021-09-11 VITALS — BP 173/84 | HR 99 | Ht 60.0 in | Wt 192.0 lb

## 2021-09-11 DIAGNOSIS — G4733 Obstructive sleep apnea (adult) (pediatric): Secondary | ICD-10-CM

## 2021-09-11 DIAGNOSIS — Z9682 Presence of neurostimulator: Secondary | ICD-10-CM

## 2021-09-11 DIAGNOSIS — E559 Vitamin D deficiency, unspecified: Secondary | ICD-10-CM | POA: Diagnosis not present

## 2021-09-11 DIAGNOSIS — I2510 Atherosclerotic heart disease of native coronary artery without angina pectoris: Secondary | ICD-10-CM | POA: Diagnosis not present

## 2021-09-11 DIAGNOSIS — Z789 Other specified health status: Secondary | ICD-10-CM | POA: Diagnosis not present

## 2021-09-11 DIAGNOSIS — E538 Deficiency of other specified B group vitamins: Secondary | ICD-10-CM | POA: Diagnosis not present

## 2021-09-11 DIAGNOSIS — G4734 Idiopathic sleep related nonobstructive alveolar hypoventilation: Secondary | ICD-10-CM | POA: Diagnosis not present

## 2021-09-11 DIAGNOSIS — M797 Fibromyalgia: Secondary | ICD-10-CM | POA: Diagnosis not present

## 2021-09-11 DIAGNOSIS — I739 Peripheral vascular disease, unspecified: Secondary | ICD-10-CM

## 2021-09-11 DIAGNOSIS — R5383 Other fatigue: Secondary | ICD-10-CM | POA: Diagnosis not present

## 2021-09-11 DIAGNOSIS — H3530 Unspecified macular degeneration: Secondary | ICD-10-CM | POA: Diagnosis not present

## 2021-09-11 DIAGNOSIS — E1159 Type 2 diabetes mellitus with other circulatory complications: Secondary | ICD-10-CM | POA: Diagnosis not present

## 2021-09-11 DIAGNOSIS — E119 Type 2 diabetes mellitus without complications: Secondary | ICD-10-CM | POA: Diagnosis not present

## 2021-09-11 DIAGNOSIS — E785 Hyperlipidemia, unspecified: Secondary | ICD-10-CM | POA: Diagnosis not present

## 2021-09-11 DIAGNOSIS — K219 Gastro-esophageal reflux disease without esophagitis: Secondary | ICD-10-CM | POA: Diagnosis not present

## 2021-09-11 DIAGNOSIS — E1169 Type 2 diabetes mellitus with other specified complication: Secondary | ICD-10-CM | POA: Diagnosis not present

## 2021-09-11 DIAGNOSIS — I152 Hypertension secondary to endocrine disorders: Secondary | ICD-10-CM | POA: Diagnosis not present

## 2021-09-11 HISTORY — DX: Presence of neurostimulator: Z96.82
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
## 2021-09-14 DIAGNOSIS — R0902 Hypoxemia: Secondary | ICD-10-CM | POA: Diagnosis not present

## 2021-09-17 ENCOUNTER — Telehealth: Payer: Self-pay | Admitting: Neurology

## 2021-09-17 DIAGNOSIS — J449 Chronic obstructive pulmonary disease, unspecified: Secondary | ICD-10-CM

## 2021-09-17 DIAGNOSIS — Z789 Other specified health status: Secondary | ICD-10-CM

## 2021-09-17 DIAGNOSIS — G4734 Idiopathic sleep related nonobstructive alveolar hypoventilation: Secondary | ICD-10-CM

## 2021-09-17 DIAGNOSIS — G4733 Obstructive sleep apnea (adult) (pediatric): Secondary | ICD-10-CM

## 2021-09-17 DIAGNOSIS — J4489 Other specified chronic obstructive pulmonary disease: Secondary | ICD-10-CM

## 2021-09-17 NOTE — Telephone Encounter (Signed)
Review of an overnight pulse oximetry for the patient Lori Byrd,  [place], [place].  [name] is using an Inspire hypoglossal nerve stimulator to treat sleep apnea.  The overnight pulse oximetry on room air was ordered for the night of the 22nd to 13 September 2021 to evaluate if hypoxia is present.   The patient has felt an ongoing impairment by fatigue and hypersomnia in spite of of compliant use of the inspire device.  This [name] shows prolonged periods  of sleep with hypoxia.   The total time at or below 88% oxygenation was 354.6 minutes.   Consecutive hypoxemia was present too, the longest time was 27.6 minutes.    This patient would qualify for oxygen in addition to continue to use her inspire device.  The lowest oxygenation was seen after 5:30 AM in the morning.  The lowest pulse rate between 1 and 3 AM.  Based on this recording the patient would qualify for oxygen use.    She would need to undergo additional testing with pulmonology to have oxygen covered. If her PCP wants to make this referral I will be happy to provide the needed data.   Larey Seat, MD 09-17-2021

## 2021-09-17 NOTE — Telephone Encounter (Signed)
Received the overnight oximetry report that the pt completed while using inspire. [name] was recorded from 09/12/21 at 6:49 pm to 09/13/2021 at 6:17 am. The lowest the oxygen level dropped was 50%. There was a total of 354 min where the oxygen was spent below 88 %. I will have Dr Dohmeier review the result and see what her thoughts and recommendations are. If oxygen is needed pt may need to be referred to pulmonology for oxygen to be added at night while patient is using inspire.  Will let the pt know what we find out.

## 2021-09-18 ENCOUNTER — Telehealth: Payer: Self-pay | Admitting: Neurology

## 2021-09-18 DIAGNOSIS — N3281 Overactive bladder: Secondary | ICD-10-CM | POA: Diagnosis not present

## 2021-09-18 DIAGNOSIS — N390 Urinary tract infection, site not specified: Secondary | ICD-10-CM | POA: Diagnosis not present

## 2021-09-18 NOTE — Telephone Encounter (Signed)
Called the pt and reviewed the data from the download. Advised the patient that she had low oxygen significantly throughout the study. Dr Dohmeier recommends that she have oxygen added at night time. Insurance won't allow her to order oxygen unless it was being bled into CPAP. The pt would need to have pulmonology referral to see about adding oxygen at night time. Pt verbalized understanding. Patient agreed with plan.

## 2021-09-18 NOTE — Telephone Encounter (Signed)
Referral sent to [place].

## 2021-09-18 NOTE — Addendum Note (Signed)
Addended by: Darleen Crocker on: 09/18/2021 09:54 AM   Modules accepted: Orders

## 2021-09-28 DIAGNOSIS — E538 Deficiency of other specified B group vitamins: Secondary | ICD-10-CM | POA: Diagnosis not present

## 2021-10-09 ENCOUNTER — Other Ambulatory Visit: Payer: Self-pay

## 2021-10-09 ENCOUNTER — Telehealth: Payer: Self-pay | Admitting: Adult Health

## 2021-10-09 ENCOUNTER — Telehealth: Payer: Self-pay | Admitting: *Deleted

## 2021-10-09 ENCOUNTER — Ambulatory Visit (INDEPENDENT_AMBULATORY_CARE_PROVIDER_SITE_OTHER): Payer: Medicare Other | Admitting: Adult Health

## 2021-10-09 ENCOUNTER — Encounter: Payer: Self-pay | Admitting: Adult Health

## 2021-10-09 ENCOUNTER — Ambulatory Visit (INDEPENDENT_AMBULATORY_CARE_PROVIDER_SITE_OTHER): Payer: Medicare Other

## 2021-10-09 VITALS — BP 120/70 | HR 84

## 2021-10-09 DIAGNOSIS — R0602 Shortness of breath: Secondary | ICD-10-CM | POA: Diagnosis not present

## 2021-10-09 DIAGNOSIS — J449 Chronic obstructive pulmonary disease, unspecified: Secondary | ICD-10-CM | POA: Diagnosis not present

## 2021-10-09 DIAGNOSIS — R0902 Hypoxemia: Secondary | ICD-10-CM | POA: Diagnosis not present

## 2021-10-09 DIAGNOSIS — G4733 Obstructive sleep apnea (adult) (pediatric): Secondary | ICD-10-CM

## 2021-10-09 DIAGNOSIS — J9611 Chronic respiratory failure with hypoxia: Secondary | ICD-10-CM

## 2021-10-09 HISTORY — DX: Chronic respiratory failure with hypoxia: J96.11

## 2021-10-09 LAB — CBC WITH DIFFERENTIAL/PLATELET
Basophils Absolute: 0 10*3/uL (ref 0.0–0.1)
Basophils Relative: 0.5 % (ref 0.0–3.0)
Eosinophils Absolute: 0.2 10*3/uL (ref 0.0–0.7)
Eosinophils Relative: 3.9 % (ref 0.0–5.0)
HCT: 40.5 % (ref 36.0–46.0)
Hemoglobin: 13.2 g/dL (ref 12.0–15.0)
Lymphocytes Relative: 35.8 % (ref 12.0–46.0)
Lymphs Abs: 1.6 10*3/uL (ref 0.7–4.0)
MCHC: 32.7 g/dL (ref 30.0–36.0)
MCV: 87.5 fl (ref 78.0–100.0)
Monocytes Absolute: 0.4 10*3/uL (ref 0.1–1.0)
Monocytes Relative: 9.5 % (ref 3.0–12.0)
Neutro Abs: 2.3 10*3/uL (ref 1.4–7.7)
Neutrophils Relative %: 50.3 % (ref 43.0–77.0)
Platelets: 269 10*3/uL (ref 150.0–400.0)
RBC: 4.63 Mil/uL (ref 3.87–5.11)
RDW: 14.9 % (ref 11.5–15.5)
WBC: 4.5 10*3/uL (ref 4.0–10.5)

## 2021-10-09 LAB — BRAIN NATRIURETIC PEPTIDE: Pro B Natriuretic peptide (BNP): 38 pg/mL (ref 0.0–100.0)

## 2021-10-09 LAB — BASIC METABOLIC PANEL
BUN: 16 mg/dL (ref 6–23)
CO2: 30 mEq/L (ref 19–32)
Calcium: 10 mg/dL (ref 8.4–10.5)
Chloride: 97 mEq/L (ref 96–112)
Creatinine, Ser: 0.99 mg/dL (ref 0.40–1.20)
GFR: 57.63 mL/min — ABNORMAL LOW (ref >60.00)
Glucose, Bld: 103 mg/dL — ABNORMAL HIGH (ref 70–99)
Potassium: 4.1 mEq/L (ref 3.5–5.1)
Sodium: 137 mEq/L (ref 135–145)

## 2021-10-09 LAB — D-DIMER, QUANTITATIVE: D-Dimer, Quant: 0.22 mcg/mL FEU (ref <0.50)

## 2021-10-09 MED ORDER — ALBUTEROL SULFATE HFA 108 (90 BASE) MCG/ACT IN AERS
1.0000 | INHALATION_SPRAY | Freq: Four times a day (QID) | RESPIRATORY_TRACT | 2 refills | Status: DC | PRN
Start: 2021-10-09 — End: 2022-03-29

## 2021-10-09 NOTE — Telephone Encounter (Signed)
Called and spoke with Dominica Severin at [facility] regarding order for new oxygen.  I wanted to make sure that she would be able to get her oxygen today.  He verbalized that as long as they received all the documents they needed, they should be able to get it to her today.  I faxed them her demographic sheet, her OV notes from today, and her walk notes.  They were faxed to [phone], received fax verification that fax was sent successfully.  Nothing further needed. ?

## 2021-10-09 NOTE — Patient Instructions (Addendum)
Continue on inspire at bedtime ?Begin oxygen 2 L with activity and at bedtime ?Set up for pulmonary function testing ?Chest x-ray today ?Labs today  ?Albuterol inhaler 1-2 puffs every 4-6hr as needed.  ?Follow up with Dr. Elsworth Soho  in 3-4 weeks with PFT and As needed   ?Please contact office for sooner follow up if symptoms do not improve or worsen or seek emergency care  ? ?

## 2021-10-09 NOTE — Progress Notes (Signed)
@Patient  ID: Lori Byrd, female    DOB: 1950-10-08, 71 y.o.   MRN: [id number]  Chief Complaint  Patient presents with   Consult    Referring provider: Melvyn Novas, MD  HPI: 72 year old female former smoker presents for pulmonary consult for October 09, 2021 for nocturnal hypoxemia.  Medical history significant for obstructive sleep apnea CPAP intolerant status post inspire device  TEST/EVENTS :  Home sleep study January 18, 2019 showed severe sleep apnea with AHI at 53/hour with frequent episodes of hypoxemia sleep apnea is worst in the prone position with AHI at 65.8/hour  10/09/2021 Consult  Patient presents today for a pulmonary consult at the kindly request of Dr. Vickey Huger .  She is followed for severe obstructive sleep apnea.  Patient had previously been recommended to use CPAP but was unable to use and tolerate CPAP.  She was referred for an evaluation for inspire device.  This was implanted by Dr. Annalee Genta in 2021   She says she has been very happy with this device.  She uses it every single night..  She definitely can tell that is made a difference in her daytime sleepiness.  However she continues to have some ongoing fatigue.  Neurology notes were reviewed from last month that showed patient used her inspire device nightly with excellent compliance for approximately 8-1/2 hours.  She is currently on 1.7 V.  Inspire titration showed OSA improved under inspire voltage of 1.6-1.8 voltage with increased PLM's.  AHI was 5.5/hour.  Snoring remained but decreased arousals.  Sleep-related hypoxemia remained present during study but improved under 1.7 V. Patient says she is going to bed about 9:00 PM.  Takes about 30 minutes to go to sleep.  Is up 4-5 times each night.  And gets up about 6 AM.  She says she turns her inspire device on when she goes to bed.  And pauses that if she goes to the bathroom and then restarts it when she goes back to bed.  She says her weight is up about  20 pounds over the last 2 years.  Patient was recently seen by neurology.  And complained of ongoing fatigue despite excellent compliance with her inspire device.  She was set up for an overnight oximetry test Which was completed on February 23rd 2023.  This noted prolonged periods of hypoxemia.  Total time at or below 88% oxygenation was 354 minutes consecutive hypoxemia was present with the longest time at 27 minutes.  Patient was referred to our office for further evaluation of ongoing nocturnal hypoxemia. Epworth score is 8.  Patient has daytime fatigue and tiredness with inactivity such as sitting still watching TV. Patient says she does have chronic shortness of breath that has been going on for a long time.  She does have intermittent cough and wheezing that is worse at nighttime.  Patient is a former smoker.  She says once before she was told that she had COPD.  But she has never had a formal evaluation.  She smoked 1 pack a day for 30 years.  She quit 26 years ago.  She has never had PFTs.  Today in the office walk test shows O2 saturations dropped down to 87% room air.  She required 2 L of oxygen to maintain saturations greater than 88 to 90%. Does report that she had COVID-19 infection x2.  Social history patient is divorced.  Has 5 adult children.  She is retired from [employer].  She lives alone.  She  does all of her shopping cleaning and drives independently.  She is a former smoker.  Quit 26 years ago.  Smoked for about 30 years 1 pack a day.  Rare alcohol use.  No history of drug use.  Family history positive for COPD congestive heart failure and sleep apnea  Surgical history: Surgery secondary to diverticulitis, coronary stents x2  Medical history significant for diabetes chronic allergies chronic headaches, sleep apnea, coronary artery disease, diverticulitis, depression, GERD, IBS, hypertension   Allergies  Allergen Reactions   Penicillins Anaphylaxis    Did it involve swelling of  the face/tongue/throat, SOB, or low BP? Yes Did it involve sudden or severe rash/hives, skin peeling, or any reaction on the inside of your mouth or nose? Yes Did you need to seek medical attention at a hospital or doctor's office? Yes When did it last happen? childhood      If all above answers are NO, may proceed with cephalosporin use.    Asa [Aspirin] Other (See Comments)    Per Dr not to take; on Plavix   Toradol [Ketorolac Tromethamine] Other (See Comments)    Told by MD not to take; on Plavix    Morphine And Related Other (See Comments)    Pt preference    Tramadol Other (See Comments)    Pt does not like how this makes her feel    Immunization History  Administered Date(s) Administered   Fluad Quad(high Dose 65+) 05/21/2021   Influenza, High Dose Seasonal PF 05/05/2019, 05/31/2019   Pneumococcal Conjugate-13 04/19/2015, 05/26/2017   Tdap 04/19/2015   Zoster Recombinat (Shingrix) 04/19/2018, 08/22/2018   Zoster, Live 11/18/2012    Past Medical History:  Diagnosis Date   Anxiety    Cancer (HCC)    skin cancer   COPD (chronic obstructive pulmonary disease) (HCC)    Coronary artery disease    Depression    Diabetes mellitus without complication (HCC)    Fibromyalgia    GERD (gastroesophageal reflux disease)    Hypertension    IBS (irritable bowel syndrome)     Tobacco History: Social History   Tobacco Use  Smoking Status Former   Types: Cigarettes  Smokeless Tobacco Never   Counseling given: Not Answered   Outpatient Medications Prior to Visit  Medication Sig Dispense Refill   amLODipine (NORVASC) 5 MG tablet Take 5 mg by mouth daily.     Ascorbic Acid (VITAMIN C) 1000 MG tablet Take 1,000 mg by mouth daily.     busPIRone (BUSPAR) 5 MG tablet Take 5 mg by mouth at bedtime.     Cholecalciferol (VITAMIN D3 PO) Take 2,500 Units by mouth daily.     clopidogrel (PLAVIX) 75 MG tablet Take 1 tablet (75 mg total) by mouth daily. Restart plavix on 07/03/19 30  tablet 12   metoprolol succinate (TOPROL-XL) 50 MG 24 hr tablet Take 50 mg by mouth daily.     Multiple Vitamin (MULTI-VITAMIN PO) Take 1 tablet by mouth daily.      Omega-3 Fatty Acids (FISH OIL) 1000 MG CAPS Take 2 capsules by mouth daily at 6 (six) AM.     pantoprazole (PROTONIX) 40 MG tablet Take 40 mg by mouth every morning.      venlafaxine XR (EFFEXOR-XR) 75 MG 24 hr capsule Take 75 mg by mouth at bedtime.      Zinc Sulfate (ZINC-220 PO) Take 1 tablet by mouth daily.     diclofenac Sodium (VOLTAREN) 1 % GEL diclofenac 1 % topical gel  APPLY TO BACK 1 TO 2 TIMES DAILY AS NEEDED FOR PAIN     estradiol (ESTRACE) 0.1 MG/GM vaginal cream estradiol 0.01% (0.1 mg/gram) vaginal cream  1 (ONE) GRAM APPLY VAGINALLY 2-3 NIGHTS A WEEK     FARXIGA 5 MG TABS tablet Take 5 mg by mouth every morning.     montelukast (SINGULAIR) 10 MG tablet TAKE 1 TABLET BY MOUTH EVERY DAY AT NIGHT (Patient not taking: Reported on 10/09/2021)     alendronate (FOSAMAX) 70 MG tablet Take 70 mg by mouth once a week. Sunday (Patient not taking: Reported on 10/09/2021)     metFORMIN (GLUCOPHAGE) 500 MG tablet Take 500 mg by mouth 2 (two) times daily.  (Patient not taking: Reported on 10/09/2021)     No facility-administered medications prior to visit.     Review of Systems:   Constitutional:   No  weight loss, night sweats,  Fevers, chills, +fatigue, or  lassitude.  HEENT:   No headaches,  Difficulty swallowing,  Tooth/dental problems, or  Sore throat,                No sneezing, itching, ear ache,  +nasal congestion, post nasal drip,   CV:  No chest pain,  Orthopnea, PND, swelling in lower extremities, anasarca, dizziness, palpitations, syncope.   GI  No heartburn, indigestion, abdominal pain, nausea, vomiting, diarrhea, change in bowel habits, loss of appetite, bloody stools.   Resp:   No chest wall deformity  Skin: no rash or lesions.  GU: no dysuria, change in color of urine, no urgency or frequency.  No flank  pain, no hematuria   MS:  No joint pain or swelling.  No decreased range of motion.  No back pain.    Physical Exam  BP 120/70 (BP Location: Left Wrist, Cuff Size: Normal)   Pulse 84   SpO2 94%   GEN: A/Ox3; pleasant , NAD, well nourished    HEENT:  [place]/AT,   NOSE-clear, THROAT-clear, no lesions, no postnasal drip or exudate noted. Class 2-3 MP airway   NECK:  Supple w/ fair ROM; no JVD; normal carotid impulses w/o bruits; no thyromegaly or nodules palpated; no lymphadenopathy.    RESP  Clear  P & A; w/o, wheezes/ rales/ or rhonchi. no accessory muscle use, no dullness to percussion  CARD:  RRR, no m/r/g, no peripheral edema, pulses intact, no cyanosis or clubbing.  GI:   Soft & nt; nml bowel sounds; no organomegaly or masses detected.   Musco: Warm bil, no deformities or joint swelling noted.   Neuro: alert, no focal deficits noted.    Skin: Warm, no lesions or rashes    Lab Results:  CBC    Component Value Date/Time   WBC 4.5 10/09/2021 1157   RBC 4.63 10/09/2021 1157   HGB 13.2 10/09/2021 1157   HCT 40.5 10/09/2021 1157   PLT 269.0 10/09/2021 1157   MCV 87.5 10/09/2021 1157   MCH 29.2 06/28/2019 0820   MCHC 32.7 10/09/2021 1157   RDW 14.9 10/09/2021 1157   LYMPHSABS 1.6 10/09/2021 1157   MONOABS 0.4 10/09/2021 1157   EOSABS 0.2 10/09/2021 1157   BASOSABS 0.0 10/09/2021 1157    BMET    Component Value Date/Time   NA 137 10/09/2021 1157   K 4.1 10/09/2021 1157   CL 97 10/09/2021 1157   CO2 30 10/09/2021 1157   GLUCOSE 103 (H) 10/09/2021 1157   BUN 16 10/09/2021 1157   CREATININE 0.99 10/09/2021 1157  CALCIUM 10.0 10/09/2021 1157   GFRNONAA >60 06/28/2019 0820   GFRAA >60 06/28/2019 0820    BNP    Component Value Date/Time   BNP 18.7 11/22/2018 0830    ProBNP    Component Value Date/Time   PROBNP 38.0 10/09/2021 1157    Imaging: DG Chest 2 View  Result Date: 10/09/2021 CLINICAL DATA:  Shortness of breath. EXAM: CHEST - 2 VIEW  COMPARISON:  Radiographs of the chest and right ribs 03/13/2020. FINDINGS: Sleep apnea implant. Redemonstrated vascular stent in the region of the right innominate vein. Heart size at the upper limits of normal. Aortic atherosclerosis. Unchanged bilateral upper lobe pulmonary scarring. No appreciable airspace consolidation or pulmonary edema. No evidence of pleural effusion or pneumothorax. No acute bony abnormality identified. Degenerative changes of the spine. IMPRESSION: No evidence of acute cardiopulmonary abnormality. Unchanged bilateral upper lobe pulmonary scarring. Aortic Atherosclerosis (ICD10-I70.0). Electronically Signed   By: Jackey Loge D.O.   On: 10/09/2021 12:19      No flowsheet data found.  No results found for: NITRICOXIDE      Assessment & Plan:   Obstructive sleep apnea Severe obstructive sleep apnea with CPAP intolerance.  Patient is underwent inspire device implantation in 2021.  Neurology notes have been reviewed.  With documentation of excellent compliance.  She has perceived benefit but has ongoing daytime fatigue and documented nocturnal hypoxemia on overnight oximetry test. Patient will continue on inspire device.  Healthy sleep regimen discussed.  Plan  Patient Instructions  Continue on inspire at bedtime Begin oxygen 2 L with activity and at bedtime Set up for pulmonary function testing Chest x-ray today Labs today  Albuterol inhaler 1-2 puffs every 4-6hr as needed.  Follow up with Dr. Vassie Loll  in 3-4 weeks with PFT and As needed   Please contact office for sooner follow up if symptoms do not improve or worsen or seek emergency care       Hypoxemia Patient with documented nocturnal hypoxemia despite excellent compliance and improved control on inspire device for underlying sleep apnea.  Today in the office she has exertional hypoxemia unclear how long this has been going on.  She does require 2 L of oxygen with activity.  Does not require oxygen at  rest. Suspect she could have some underlying COPD.  Chest x-ray today shows unchanged bilateral upper lobe pulmonary scarring.  Lab work shows no sign of anemia.  D-dimer and BNP are not normal.  Low suspicion for PE.  Suspect this may be chronic in nature.  Patient will begin oxygen 2 L with activity and at bedtime. She will return for pulmonary function testing.  May use albuterol as needed.  Plan  Patient Instructions  Continue on inspire at bedtime Begin oxygen 2 L with activity and at bedtime Set up for pulmonary function testing Chest x-ray today Labs today  Albuterol inhaler 1-2 puffs every 4-6hr as needed.  Follow up with Dr. Vassie Loll  in 3-4 weeks with PFT and As needed   Please contact office for sooner follow up if symptoms do not improve or worsen or seek emergency care          Rubye Oaks, NP 10/09/2021

## 2021-10-09 NOTE — Assessment & Plan Note (Signed)
Severe obstructive sleep apnea with CPAP intolerance.  Patient is underwent inspire device implantation in 2021.  Neurology notes have been reviewed.  With documentation of excellent compliance.  She has perceived benefit but has ongoing daytime fatigue and documented nocturnal hypoxemia on overnight oximetry test. ?Patient will continue on inspire device.  Healthy sleep regimen discussed. ? ?Plan  ?Patient Instructions  ?Continue on inspire at bedtime ?Begin oxygen 2 L with activity and at bedtime ?Set up for pulmonary function testing ?Chest x-ray today ?Labs today  ?Albuterol inhaler 1-2 puffs every 4-6hr as needed.  ?Follow up with Dr. Elsworth Soho  in 3-4 weeks with PFT and As needed   ?Please contact office for sooner follow up if symptoms do not improve or worsen or seek emergency care  ? ?  ? ?

## 2021-10-09 NOTE — Assessment & Plan Note (Addendum)
Patient with documented nocturnal hypoxemia despite excellent compliance and improved control on inspire device for underlying sleep apnea.  Today in the office she has exertional hypoxemia unclear how long this has been going on.  She does require 2 L of oxygen with activity.  Does not require oxygen at rest. ?Suspect she could have some underlying COPD.  ?Chest x-ray today shows unchanged bilateral upper lobe pulmonary scarring.  Lab work shows no sign of anemia.  D-dimer and BNP are not normal.  ?Low suspicion for PE.  Suspect this may be chronic in nature.  Patient will begin oxygen 2 L with activity and at bedtime. ?She will return for pulmonary function testing.  May use albuterol as needed. ? ?Plan  ?Patient Instructions  ?Continue on inspire at bedtime ?Begin oxygen 2 L with activity and at bedtime ?Set up for pulmonary function testing ?Chest x-ray today ?Labs today  ?Albuterol inhaler 1-2 puffs every 4-6hr as needed.  ?Follow up with Dr. Elsworth Soho  in 3-4 weeks with PFT and As needed   ?Please contact office for sooner follow up if symptoms do not improve or worsen or seek emergency care  ? ?  ? ?Late add : Check 2 D echo (r/o Pulmonary Hypertension)  ?

## 2021-10-10 ENCOUNTER — Telehealth: Payer: Self-pay | Admitting: Adult Health

## 2021-10-10 DIAGNOSIS — R0602 Shortness of breath: Secondary | ICD-10-CM

## 2021-10-10 NOTE — Telephone Encounter (Signed)
See other encounter from 10/09/21. Will close this encounter.  ?

## 2021-10-10 NOTE — Telephone Encounter (Signed)
Called to discuss with patient would like her to have 2 D echo (to evaluate for possible pulmonary hypertension )  ? ?Left message to call back .  ?

## 2021-10-12 NOTE — Telephone Encounter (Signed)
Called and spoke with patient. I explained to her what pulmonary hypertension was and got the order put in. Nothing further needed.  ?

## 2021-10-12 NOTE — Progress Notes (Signed)
LMOM for patient to call office back. Call back information provided.

## 2021-10-17 ENCOUNTER — Encounter: Payer: Self-pay | Admitting: *Deleted

## 2021-10-17 ENCOUNTER — Telehealth: Payer: Self-pay | Admitting: Adult Health

## 2021-10-17 DIAGNOSIS — J449 Chronic obstructive pulmonary disease, unspecified: Secondary | ICD-10-CM | POA: Diagnosis not present

## 2021-10-17 NOTE — Telephone Encounter (Signed)
Called and spoke with Patient, provided results/recommendations per Rexene Edison NP.  She verbalized understanding.  She stated that [facility] was out of network for her insurance and [facility] was going to supply her oxygen.  I provided her with the phone # for [facility] so she could contact them regarding the tank they loaned her.  He has an echo scheduled for 5/5 and will come back for a PFT and f/u with Dr. Elsworth Soho on 5/8.  Patient made aware to arrive by 12:45 pm for the PFT.  She verbalized understanding.  Nothing further needed. ?

## 2021-10-17 NOTE — Progress Notes (Signed)
ATC x2.  LVM to return call.  Unable to reach letter sent.  Closing encounter per policy.

## 2021-10-24 ENCOUNTER — Ambulatory Visit (HOSPITAL_COMMUNITY): Payer: Medicare Other | Attending: Cardiovascular Disease

## 2021-10-24 DIAGNOSIS — R0602 Shortness of breath: Secondary | ICD-10-CM | POA: Insufficient documentation

## 2021-10-24 LAB — ECHOCARDIOGRAM COMPLETE
Area-P 1/2: 4.8 cm2
S' Lateral: 2 cm

## 2021-10-29 NOTE — Progress Notes (Signed)
Called and spoke with patient, advised of results/recommendations per Tammy Parrett NP.  She verbalized understanding.  Nothing further needed.

## 2021-10-30 DIAGNOSIS — E538 Deficiency of other specified B group vitamins: Secondary | ICD-10-CM | POA: Diagnosis not present

## 2021-11-12 DIAGNOSIS — H353132 Nonexudative age-related macular degeneration, bilateral, intermediate dry stage: Secondary | ICD-10-CM | POA: Diagnosis not present

## 2021-11-13 DIAGNOSIS — H5213 Myopia, bilateral: Secondary | ICD-10-CM | POA: Diagnosis not present

## 2021-11-17 DIAGNOSIS — J449 Chronic obstructive pulmonary disease, unspecified: Secondary | ICD-10-CM | POA: Diagnosis not present

## 2021-11-26 ENCOUNTER — Encounter: Payer: Self-pay | Admitting: Pulmonary Disease

## 2021-11-26 ENCOUNTER — Ambulatory Visit (INDEPENDENT_AMBULATORY_CARE_PROVIDER_SITE_OTHER): Payer: Medicare Other | Admitting: Pulmonary Disease

## 2021-11-26 VITALS — BP 140/84 | HR 101 | Ht 61.0 in | Wt 197.8 lb

## 2021-11-26 DIAGNOSIS — R0602 Shortness of breath: Secondary | ICD-10-CM

## 2021-11-26 DIAGNOSIS — J9611 Chronic respiratory failure with hypoxia: Secondary | ICD-10-CM

## 2021-11-26 DIAGNOSIS — G4733 Obstructive sleep apnea (adult) (pediatric): Secondary | ICD-10-CM | POA: Diagnosis not present

## 2021-11-26 DIAGNOSIS — J449 Chronic obstructive pulmonary disease, unspecified: Secondary | ICD-10-CM

## 2021-11-26 LAB — PULMONARY FUNCTION TEST
DL/VA % pred: 83 %
DL/VA: 3.54 ml/min/mmHg/L
DLCO cor % pred: 67 %
DLCO cor: 11.87 ml/min/mmHg
DLCO unc % pred: 67 %
DLCO unc: 11.8 ml/min/mmHg
FEF 25-75 Post: 2.29 L/sec
FEF 25-75 Pre: 2.01 L/sec
FEF2575-%Change-Post: 13 %
FEF2575-%Pred-Post: 132 %
FEF2575-%Pred-Pre: 115 %
FEV1-%Change-Post: 1 %
FEV1-%Pred-Post: 96 %
FEV1-%Pred-Pre: 95 %
FEV1-Post: 1.92 L
FEV1-Pre: 1.9 L
FEV1FVC-%Change-Post: 2 %
FEV1FVC-%Pred-Pre: 108 %
FEV6-%Change-Post: 0 %
FEV6-%Pred-Post: 91 %
FEV6-%Pred-Pre: 91 %
FEV6-Post: 2.3 L
FEV6-Pre: 2.31 L
FEV6FVC-%Change-Post: 0 %
FEV6FVC-%Pred-Post: 104 %
FEV6FVC-%Pred-Pre: 104 %
FVC-%Change-Post: 0 %
FVC-%Pred-Post: 87 %
FVC-%Pred-Pre: 87 %
FVC-Post: 2.3 L
FVC-Pre: 2.31 L
Post FEV1/FVC ratio: 84 %
Post FEV6/FVC ratio: 100 %
Pre FEV1/FVC ratio: 82 %
Pre FEV6/FVC Ratio: 100 %
RV % pred: 81 %
RV: 1.66 L
TLC % pred: 87 %
TLC: 4.02 L

## 2021-11-26 NOTE — Patient Instructions (Signed)
?  X HUmidifier for O2 ? ?X AMbulatory sat ? ?X ONO on room air  ?

## 2021-11-26 NOTE — Assessment & Plan Note (Signed)
Appears to be controlled with inspire device. ?Download will be reviewed ?

## 2021-11-26 NOTE — Progress Notes (Signed)
   Subjective:    Patient ID: Lori Byrd, female    DOB: October 04, 1950, 71 y.o.   MRN: [id number]  HPI  71 year old female former smoker referred by neurology for evaluation of nocturnal hypoxemia.   PMH - OSA,CPAP intolerant status post inspire device 2021  -CAD s/p stents x 2 - She smoked 1 pack a day for 30 years.  She quit 26 years ago.  On her initial office visit, she was noted to desaturate on ambulation and placed on portable oxygen.  She underwent a work-up including echo and PFTs which were reviewed today.  She is compliant with nocturnal oxygen but overall feels well and denies dyspnea on exertion.  She is compliant with her inspire device and previous download has shown good control of events on this  Significant tests/ events reviewed 09/2021 ambulatory saturation, desat to 86% on first lap 11/2021 desat to 85% on second lap, recovered with 2 L of oxygen  PFTs 11/2021 no airway obstruction with FEV1 95%, ratio 82, TLC 87% and DLCO 11.80/67%  Echo 10/2021 normal RVSP, decreased RV systolic function , LVH with small LV outflow gradient, no S.A.M.  HST 6/ 2020 showed severe sleep apnea with AHI at 53/hour with frequent episodes of hypoxemia sleep apnea is worst in the prone position with AHI at 65.8/hour  ONO 09/13/21.  prolonged periods of hypoxemia.  Total time at or below 88% oxygenation was 354 minutes consecutive hypoxemia was present with the longest time at 27 minutes.   Review of Systems  neg for any significant sore throat, dysphagia, itching, sneezing, nasal congestion or excess/ purulent secretions, fever, chills, sweats, unintended wt loss, pleuritic or exertional cp, hempoptysis, orthopnea pnd or change in chronic leg swelling. Also denies presyncope, palpitations, heartburn, abdominal pain, nausea, vomiting, diarrhea or change in bowel or urinary habits, dysuria,hematuria, rash, arthralgias, visual complaints, headache, numbness weakness or ataxia.      Objective:   Physical Exam  Gen. Pleasant, obese, in no distress ENT - no lesions, no post nasal drip Neck: No JVD, no thyromegaly, no carotid bruits Lungs: no use of accessory muscles, no dullness to percussion, decreased without rales or rhonchi  Cardiovascular: Rhythm regular, heart sounds  normal, no murmurs or gallops, no peripheral edema Musculoskeletal: No deformities, no cyanosis or clubbing , no tremors        Assessment & Plan:

## 2021-11-26 NOTE — Patient Instructions (Signed)
Performed Full PFT Today.   ?

## 2021-11-26 NOTE — Assessment & Plan Note (Addendum)
She does not seem to have airway obstruction on PFTs.  She is a remote smoker. ?Diffusion capacity is decreased but hypoxia seems to be out of proportion.  Decreased DLCO could be related to pulm hypertension.  Echo does not show significant increase in RVSP but RV function appears to be decreased.  This is perplexing. ?If he wanted to further investigate, she would need a right heart cath.  I will have cardiology review echo to make sure there is no PFO that is being missed ? ?At this time, she does seem to require oxygen both during sleep as well as on exertion.  It is okay for her to stay off oxygen while at rest ?

## 2021-11-26 NOTE — Progress Notes (Signed)
Performed Full PFT today.  ?

## 2021-11-27 DIAGNOSIS — E119 Type 2 diabetes mellitus without complications: Secondary | ICD-10-CM | POA: Diagnosis not present

## 2021-11-27 DIAGNOSIS — E1142 Type 2 diabetes mellitus with diabetic polyneuropathy: Secondary | ICD-10-CM | POA: Diagnosis not present

## 2021-11-27 DIAGNOSIS — M21611 Bunion of right foot: Secondary | ICD-10-CM | POA: Diagnosis not present

## 2021-11-27 DIAGNOSIS — M21612 Bunion of left foot: Secondary | ICD-10-CM | POA: Diagnosis not present

## 2021-11-28 DIAGNOSIS — Z139 Encounter for screening, unspecified: Secondary | ICD-10-CM | POA: Diagnosis not present

## 2021-11-28 DIAGNOSIS — Z0000 Encounter for general adult medical examination without abnormal findings: Secondary | ICD-10-CM | POA: Diagnosis not present

## 2021-11-28 DIAGNOSIS — Z9181 History of falling: Secondary | ICD-10-CM | POA: Diagnosis not present

## 2021-11-28 DIAGNOSIS — E785 Hyperlipidemia, unspecified: Secondary | ICD-10-CM | POA: Diagnosis not present

## 2021-12-03 ENCOUNTER — Other Ambulatory Visit: Payer: Self-pay

## 2021-12-03 DIAGNOSIS — I272 Pulmonary hypertension, unspecified: Secondary | ICD-10-CM

## 2021-12-03 DIAGNOSIS — E538 Deficiency of other specified B group vitamins: Secondary | ICD-10-CM | POA: Diagnosis not present

## 2021-12-03 NOTE — Progress Notes (Signed)
Placed order for limited bubble study echo. ?

## 2021-12-06 ENCOUNTER — Ambulatory Visit (INDEPENDENT_AMBULATORY_CARE_PROVIDER_SITE_OTHER): Payer: Medicare Other

## 2021-12-06 DIAGNOSIS — I272 Pulmonary hypertension, unspecified: Secondary | ICD-10-CM

## 2021-12-06 LAB — ECHOCARDIOGRAM LIMITED BUBBLE STUDY: S' Lateral: 2.4 cm

## 2021-12-07 ENCOUNTER — Telehealth: Payer: Self-pay | Admitting: Pulmonary Disease

## 2021-12-07 DIAGNOSIS — J449 Chronic obstructive pulmonary disease, unspecified: Secondary | ICD-10-CM

## 2021-12-07 DIAGNOSIS — J9611 Chronic respiratory failure with hypoxia: Secondary | ICD-10-CM

## 2021-12-07 NOTE — Telephone Encounter (Signed)
Have you received ONO results?

## 2021-12-10 NOTE — Telephone Encounter (Addendum)
Results have been received and given to Dr. Elsworth Soho

## 2021-12-10 NOTE — Telephone Encounter (Signed)
Called and spoke with Lori Byrd [facility] and asked her to fax over ONO results. Will await fax

## 2021-12-11 DIAGNOSIS — L739 Follicular disorder, unspecified: Secondary | ICD-10-CM | POA: Diagnosis not present

## 2021-12-11 DIAGNOSIS — M797 Fibromyalgia: Secondary | ICD-10-CM | POA: Diagnosis not present

## 2021-12-11 DIAGNOSIS — E785 Hyperlipidemia, unspecified: Secondary | ICD-10-CM | POA: Diagnosis not present

## 2021-12-11 DIAGNOSIS — R5383 Other fatigue: Secondary | ICD-10-CM | POA: Diagnosis not present

## 2021-12-11 DIAGNOSIS — E1169 Type 2 diabetes mellitus with other specified complication: Secondary | ICD-10-CM | POA: Diagnosis not present

## 2021-12-11 DIAGNOSIS — E1159 Type 2 diabetes mellitus with other circulatory complications: Secondary | ICD-10-CM | POA: Diagnosis not present

## 2021-12-11 DIAGNOSIS — E119 Type 2 diabetes mellitus without complications: Secondary | ICD-10-CM | POA: Diagnosis not present

## 2021-12-11 DIAGNOSIS — I152 Hypertension secondary to endocrine disorders: Secondary | ICD-10-CM | POA: Diagnosis not present

## 2021-12-11 DIAGNOSIS — E538 Deficiency of other specified B group vitamins: Secondary | ICD-10-CM | POA: Diagnosis not present

## 2021-12-11 DIAGNOSIS — K219 Gastro-esophageal reflux disease without esophagitis: Secondary | ICD-10-CM | POA: Diagnosis not present

## 2021-12-11 NOTE — Telephone Encounter (Signed)
Rigoberto Noel, MD  You 3 hours ago (8:49 AM)   ONO showed significant desat > 4h  Continue using oxygen during sleep  I note that bubble study did not show any cardiac issues    Called and spoke with patient to let her know results of ONO. She expressed understanding. She is asking for order be sent to [facility] to add humidification because he has been having nose bleeds. Order has been placed. Nothing further needed at this time.

## 2021-12-17 DIAGNOSIS — J449 Chronic obstructive pulmonary disease, unspecified: Secondary | ICD-10-CM | POA: Diagnosis not present

## 2021-12-24 DIAGNOSIS — E1140 Type 2 diabetes mellitus with diabetic neuropathy, unspecified: Secondary | ICD-10-CM | POA: Diagnosis not present

## 2021-12-27 ENCOUNTER — Telehealth: Payer: Self-pay | Admitting: Pulmonary Disease

## 2021-12-28 NOTE — Telephone Encounter (Signed)
Routing to [facility] for help with this. Order was placed 12/11/21, not 5/8.

## 2021-12-28 NOTE — Telephone Encounter (Signed)
I sent a high importance message to [facility] to see what's going on.

## 2022-01-01 NOTE — Telephone Encounter (Signed)
Coltrane, Lance Bosch, Eusebio Me, [place] I will send a service rep to see her today

## 2022-01-03 DIAGNOSIS — E538 Deficiency of other specified B group vitamins: Secondary | ICD-10-CM | POA: Diagnosis not present

## 2022-01-08 DIAGNOSIS — H905 Unspecified sensorineural hearing loss: Secondary | ICD-10-CM | POA: Diagnosis not present

## 2022-01-17 DIAGNOSIS — N390 Urinary tract infection, site not specified: Secondary | ICD-10-CM | POA: Diagnosis not present

## 2022-01-17 DIAGNOSIS — J449 Chronic obstructive pulmonary disease, unspecified: Secondary | ICD-10-CM | POA: Diagnosis not present

## 2022-01-17 DIAGNOSIS — N3281 Overactive bladder: Secondary | ICD-10-CM | POA: Diagnosis not present

## 2022-01-29 DIAGNOSIS — Z87892 Personal history of anaphylaxis: Secondary | ICD-10-CM | POA: Diagnosis not present

## 2022-01-29 DIAGNOSIS — Z880 Allergy status to penicillin: Secondary | ICD-10-CM | POA: Diagnosis not present

## 2022-01-29 DIAGNOSIS — I6521 Occlusion and stenosis of right carotid artery: Secondary | ICD-10-CM | POA: Diagnosis not present

## 2022-01-29 DIAGNOSIS — I10 Essential (primary) hypertension: Secondary | ICD-10-CM | POA: Diagnosis not present

## 2022-01-29 DIAGNOSIS — E119 Type 2 diabetes mellitus without complications: Secondary | ICD-10-CM | POA: Diagnosis not present

## 2022-01-29 DIAGNOSIS — R9082 White matter disease, unspecified: Secondary | ICD-10-CM | POA: Diagnosis not present

## 2022-01-29 DIAGNOSIS — R519 Headache, unspecified: Secondary | ICD-10-CM | POA: Diagnosis not present

## 2022-01-30 DIAGNOSIS — I10 Essential (primary) hypertension: Secondary | ICD-10-CM | POA: Diagnosis not present

## 2022-01-30 DIAGNOSIS — Z880 Allergy status to penicillin: Secondary | ICD-10-CM | POA: Diagnosis not present

## 2022-01-30 DIAGNOSIS — I6521 Occlusion and stenosis of right carotid artery: Secondary | ICD-10-CM | POA: Diagnosis not present

## 2022-01-30 DIAGNOSIS — Z87892 Personal history of anaphylaxis: Secondary | ICD-10-CM | POA: Diagnosis not present

## 2022-01-30 DIAGNOSIS — E119 Type 2 diabetes mellitus without complications: Secondary | ICD-10-CM | POA: Diagnosis not present

## 2022-01-30 DIAGNOSIS — R519 Headache, unspecified: Secondary | ICD-10-CM | POA: Diagnosis not present

## 2022-02-04 DIAGNOSIS — I152 Hypertension secondary to endocrine disorders: Secondary | ICD-10-CM | POA: Diagnosis not present

## 2022-02-04 DIAGNOSIS — I6529 Occlusion and stenosis of unspecified carotid artery: Secondary | ICD-10-CM | POA: Diagnosis not present

## 2022-02-04 DIAGNOSIS — E1159 Type 2 diabetes mellitus with other circulatory complications: Secondary | ICD-10-CM | POA: Diagnosis not present

## 2022-02-04 DIAGNOSIS — E538 Deficiency of other specified B group vitamins: Secondary | ICD-10-CM | POA: Diagnosis not present

## 2022-02-05 DIAGNOSIS — E1142 Type 2 diabetes mellitus with diabetic polyneuropathy: Secondary | ICD-10-CM | POA: Diagnosis not present

## 2022-02-08 ENCOUNTER — Other Ambulatory Visit: Payer: Self-pay

## 2022-02-08 DIAGNOSIS — E1169 Type 2 diabetes mellitus with other specified complication: Secondary | ICD-10-CM | POA: Insufficient documentation

## 2022-02-08 DIAGNOSIS — Z789 Other specified health status: Secondary | ICD-10-CM | POA: Insufficient documentation

## 2022-02-08 DIAGNOSIS — I519 Heart disease, unspecified: Secondary | ICD-10-CM | POA: Insufficient documentation

## 2022-02-08 DIAGNOSIS — F32A Depression, unspecified: Secondary | ICD-10-CM | POA: Insufficient documentation

## 2022-02-08 DIAGNOSIS — J449 Chronic obstructive pulmonary disease, unspecified: Secondary | ICD-10-CM | POA: Insufficient documentation

## 2022-02-08 DIAGNOSIS — K219 Gastro-esophageal reflux disease without esophagitis: Secondary | ICD-10-CM | POA: Insufficient documentation

## 2022-02-08 DIAGNOSIS — H3530 Unspecified macular degeneration: Secondary | ICD-10-CM | POA: Insufficient documentation

## 2022-02-08 DIAGNOSIS — C801 Malignant (primary) neoplasm, unspecified: Secondary | ICD-10-CM | POA: Insufficient documentation

## 2022-02-08 DIAGNOSIS — M1990 Unspecified osteoarthritis, unspecified site: Secondary | ICD-10-CM | POA: Insufficient documentation

## 2022-02-08 DIAGNOSIS — I6529 Occlusion and stenosis of unspecified carotid artery: Secondary | ICD-10-CM | POA: Insufficient documentation

## 2022-02-08 DIAGNOSIS — I739 Peripheral vascular disease, unspecified: Secondary | ICD-10-CM | POA: Insufficient documentation

## 2022-02-08 DIAGNOSIS — Z6836 Body mass index (BMI) 36.0-36.9, adult: Secondary | ICD-10-CM | POA: Insufficient documentation

## 2022-02-08 DIAGNOSIS — M797 Fibromyalgia: Secondary | ICD-10-CM | POA: Insufficient documentation

## 2022-02-08 DIAGNOSIS — F419 Anxiety disorder, unspecified: Secondary | ICD-10-CM | POA: Insufficient documentation

## 2022-02-08 DIAGNOSIS — K589 Irritable bowel syndrome without diarrhea: Secondary | ICD-10-CM | POA: Insufficient documentation

## 2022-02-08 DIAGNOSIS — I10 Essential (primary) hypertension: Secondary | ICD-10-CM | POA: Insufficient documentation

## 2022-02-08 DIAGNOSIS — E119 Type 2 diabetes mellitus without complications: Secondary | ICD-10-CM | POA: Insufficient documentation

## 2022-02-08 DIAGNOSIS — I2510 Atherosclerotic heart disease of native coronary artery without angina pectoris: Secondary | ICD-10-CM | POA: Insufficient documentation

## 2022-02-08 DIAGNOSIS — E538 Deficiency of other specified B group vitamins: Secondary | ICD-10-CM | POA: Insufficient documentation

## 2022-02-11 DIAGNOSIS — I152 Hypertension secondary to endocrine disorders: Secondary | ICD-10-CM | POA: Diagnosis not present

## 2022-02-11 DIAGNOSIS — E1159 Type 2 diabetes mellitus with other circulatory complications: Secondary | ICD-10-CM | POA: Diagnosis not present

## 2022-02-16 DIAGNOSIS — J449 Chronic obstructive pulmonary disease, unspecified: Secondary | ICD-10-CM | POA: Diagnosis not present

## 2022-02-20 DIAGNOSIS — E1159 Type 2 diabetes mellitus with other circulatory complications: Secondary | ICD-10-CM | POA: Diagnosis not present

## 2022-02-20 DIAGNOSIS — I152 Hypertension secondary to endocrine disorders: Secondary | ICD-10-CM | POA: Diagnosis not present

## 2022-02-28 DIAGNOSIS — R5383 Other fatigue: Secondary | ICD-10-CM | POA: Diagnosis not present

## 2022-03-11 ENCOUNTER — Telehealth: Payer: Self-pay | Admitting: Neurology

## 2022-03-11 DIAGNOSIS — J449 Chronic obstructive pulmonary disease, unspecified: Secondary | ICD-10-CM

## 2022-03-11 DIAGNOSIS — G4710 Hypersomnia, unspecified: Secondary | ICD-10-CM

## 2022-03-11 DIAGNOSIS — E1159 Type 2 diabetes mellitus with other circulatory complications: Secondary | ICD-10-CM | POA: Diagnosis not present

## 2022-03-11 DIAGNOSIS — I152 Hypertension secondary to endocrine disorders: Secondary | ICD-10-CM | POA: Diagnosis not present

## 2022-03-11 DIAGNOSIS — Z9682 Presence of neurostimulator: Secondary | ICD-10-CM

## 2022-03-11 DIAGNOSIS — G4733 Obstructive sleep apnea (adult) (pediatric): Secondary | ICD-10-CM

## 2022-03-11 DIAGNOSIS — E119 Type 2 diabetes mellitus without complications: Secondary | ICD-10-CM | POA: Diagnosis not present

## 2022-03-11 DIAGNOSIS — I6529 Occlusion and stenosis of unspecified carotid artery: Secondary | ICD-10-CM | POA: Diagnosis not present

## 2022-03-11 DIAGNOSIS — K219 Gastro-esophageal reflux disease without esophagitis: Secondary | ICD-10-CM | POA: Diagnosis not present

## 2022-03-11 DIAGNOSIS — E538 Deficiency of other specified B group vitamins: Secondary | ICD-10-CM | POA: Diagnosis not present

## 2022-03-11 DIAGNOSIS — E1169 Type 2 diabetes mellitus with other specified complication: Secondary | ICD-10-CM | POA: Diagnosis not present

## 2022-03-11 DIAGNOSIS — Z789 Other specified health status: Secondary | ICD-10-CM

## 2022-03-11 NOTE — Telephone Encounter (Signed)
Pt is calling stated she went to her (PCP) and they suggest she get a sleep study because she is waking up so tired. Pt is requesting a  nurse give her a call

## 2022-03-11 NOTE — Telephone Encounter (Signed)
Called the patient and she states that she is waking up tired despite her inspire device along with using oxygen at bedtime. Pt states she is compliant with oxygen. I advised that Dr Brett Fairy states that we can order a overnight sleep study and assess if inspire is effectively working. Advised I would place the order and let the sleep lab know. Advised if for whatever reason insurance indicates that we have to have a visit first I will call her and let her know. Otherwise Dr Brett Fairy was ok with her getting the sleep study completed. Pt verbalized understanding.

## 2022-03-12 ENCOUNTER — Encounter: Payer: Self-pay | Admitting: Cardiology

## 2022-03-12 ENCOUNTER — Ambulatory Visit (INDEPENDENT_AMBULATORY_CARE_PROVIDER_SITE_OTHER): Payer: Medicare Other | Admitting: Cardiology

## 2022-03-12 VITALS — BP 160/70 | HR 99 | Ht 61.0 in | Wt 188.4 lb

## 2022-03-12 DIAGNOSIS — E113213 Type 2 diabetes mellitus with mild nonproliferative diabetic retinopathy with macular edema, bilateral: Secondary | ICD-10-CM

## 2022-03-12 DIAGNOSIS — R0989 Other specified symptoms and signs involving the circulatory and respiratory systems: Secondary | ICD-10-CM | POA: Diagnosis not present

## 2022-03-12 DIAGNOSIS — I10 Essential (primary) hypertension: Secondary | ICD-10-CM | POA: Diagnosis not present

## 2022-03-12 DIAGNOSIS — G4733 Obstructive sleep apnea (adult) (pediatric): Secondary | ICD-10-CM

## 2022-03-12 DIAGNOSIS — E669 Obesity, unspecified: Secondary | ICD-10-CM

## 2022-03-12 DIAGNOSIS — R0609 Other forms of dyspnea: Secondary | ICD-10-CM

## 2022-03-12 DIAGNOSIS — R011 Cardiac murmur, unspecified: Secondary | ICD-10-CM

## 2022-03-12 DIAGNOSIS — I2510 Atherosclerotic heart disease of native coronary artery without angina pectoris: Secondary | ICD-10-CM | POA: Diagnosis not present

## 2022-03-12 HISTORY — DX: Cardiac murmur, unspecified: R01.1

## 2022-03-12 HISTORY — DX: Obesity, unspecified: E66.9

## 2022-03-12 HISTORY — DX: Other forms of dyspnea: R06.09

## 2022-03-12 MED ORDER — NITROGLYCERIN 0.4 MG SL SUBL: 0.4000 mg | SUBLINGUAL_TABLET | SUBLINGUAL | 6 refills | Status: DC | PRN

## 2022-03-12 NOTE — Patient Instructions (Addendum)
Medication Instructions:  Your physician has recommended you make the following change in your medication:   Use nitroglycerin 1 tablet placed under the tongue at the first sign of chest pain or an angina attack. 1 tablet may be used every 5 minutes as needed, for up to 15 minutes. Do not take more than 3 tablets in 15 minutes. If pain persist call 911 or go to the nearest ED.  *If you need a refill on your cardiac medications before your next appointment, please call your pharmacy*   Lab Work: None ordered If you have labs (blood work) drawn today and your tests are completely normal, you will receive your results only by: [place] (if you have MyChart) OR A paper copy in the mail If you have any lab test that is abnormal or we need to change your treatment, we will call you to review the results.   Testing/Procedures: Your physician has requested that you have a lexiscan myoview. For further information please visit [email or web address]. Please follow instruction sheet, as given.  The test will take approximately 3 to 4 hours to complete; you may bring reading material.  If someone comes with you to your appointment, they will need to remain in the main lobby due to limited space in the testing area.  How to prepare for your Myocardial Perfusion Test: Do not eat or drink 3 hours prior to your test, except you may have water. Do not consume products containing caffeine (regular or decaffeinated) 12 hours prior to your test. (ex: coffee, chocolate, sodas, tea). Do bring a list of your current medications with you.  If not listed below, you may take your medications as normal. Do not take metoprolol (Lopressor, Toprol) for 24 hours prior to the test.  Bring the medication to your appointment as you may be required to take it once the test is complete. Do wear comfortable clothes (no dresses or overalls) and walking shoes, tennis shoes preferred (No heels or open toe shoes are  allowed). Do NOT wear cologne, perfume, aftershave, or lotions (deodorant is allowed). If these instructions are not followed, your test will have to be rescheduled.  Your physician has requested that you have an echocardiogram. Echocardiography is a painless test that uses sound waves to create images of your heart. It provides your doctor with information about the size and shape of your heart and how well your heart's chambers and valves are working. This procedure takes approximately one hour. There are no restrictions for this procedure.  Your physician has requested that you have a carotid duplex. This test is an ultrasound of the carotid arteries in your neck. It looks at blood flow through these arteries that supply the brain with blood. Allow one hour for this exam. There are no restrictions or special instructions.  Follow-Up: At [facility], you and your health needs are our priority.  As part of our continuing mission to provide you with exceptional heart care, we have created designated Provider Care Teams.  These Care Teams include your primary Cardiologist (physician) and Advanced Practice Providers (APPs -  Physician Assistants and Nurse Practitioners) who all work together to provide you with the care you need, when you need it.  We recommend signing up for the patient portal called "MyChart".  Sign up information is provided on this After Visit Summary.  MyChart is used to connect with patients for Virtual Visits (Telemedicine).  Patients are able to view lab/test results, encounter notes, upcoming appointments, etc.  Non-urgent messages can be sent to your provider as well.   To learn more about what you can do with MyChart, go to [email or web address].    Your next appointment:   2 month(s)  The format for your next appointment:   In Person  Provider:   Jyl Heinz, MD   Other Instructions Cardiac Nuclear Scan A cardiac nuclear scan is a test that is done to  check the flow of blood to your heart. It is done when you are resting and when you are exercising. The test looks for problems such as: Not enough blood reaching a portion of the heart. The heart muscle not working as it should. You may need this test if: You have heart disease. You have had lab results that are not normal. You have had heart surgery or a balloon procedure to open up blocked arteries (angioplasty). You have chest pain. You have shortness of breath. In this test, a special dye (tracer) is put into your bloodstream. The tracer will travel to your heart. A camera will then take pictures of your heart to see how the tracer moves through your heart. This test is usually done at a hospital and takes 2-4 hours. Tell a doctor about: Any allergies you have. All medicines you are taking, including vitamins, herbs, eye drops, creams, and over-the-counter medicines. Any problems you or family members have had with anesthetic medicines. Any blood disorders you have. Any surgeries you have had. Any medical conditions you have. Whether you are pregnant or may be pregnant. What are the risks? Generally, this is a safe test. However, problems may occur, such as: Serious chest pain and heart attack. This is only a risk if the stress portion of the test is done. Rapid heartbeat. A feeling of warmth in your chest. This feeling usually does not last long. Allergic reaction to the tracer. What happens before the test? Ask your doctor about changing or stopping your normal medicines. This is important. Follow instructions from your doctor about what you cannot eat or drink. Remove your jewelry on the day of the test. What happens during the test? An IV tube will be inserted into one of your veins. Your doctor will give you a small amount of tracer through the IV tube. You will wait for 20-40 minutes while the tracer moves through your bloodstream. Your heart will be monitored with an  electrocardiogram (ECG). You will lie down on an exam table. Pictures of your heart will be taken for about 15-20 minutes. You may also have a stress test. For this test, one of these things may be done: You will be asked to exercise on a treadmill or a stationary bike. You will be given medicines that will make your heart work harder. This is done if you are unable to exercise. When blood flow to your heart has peaked, a tracer will again be given through the IV tube. After 20-40 minutes, you will get back on the exam table. More pictures will be taken of your heart. Depending on the tracer that is used, more pictures may need to be taken 3-4 hours later. Your IV tube will be removed when the test is over. The test may vary among doctors and hospitals. What happens after the test? Ask your doctor: Whether you can return to your normal schedule, including diet, activities, and medicines. Whether you should drink more fluids. This will help to remove the tracer from your body. Drink enough fluid to keep your pee (  urine) pale yellow. Ask your doctor, or the department that is doing the test: When will my results be ready? How will I get my results? Summary A cardiac nuclear scan is a test that is done to check the flow of blood to your heart. Tell your doctor whether you are pregnant or may be pregnant. Before the test, ask your doctor about changing or stopping your normal medicines. This is important. Ask your doctor whether you can return to your normal activities. You may be asked to drink more fluids. This information is not intended to replace advice given to you by your health care provider. Make sure you discuss any questions you have with your health care provider. Document Revised: 10/28/2018 Document Reviewed: 12/22/2017 Elsevier Patient Education  2021 [place].  Nitroglycerin Sublingual Tablets What is this medication? NITROGLYCERIN (nye troe GLI ser in) prevents and  treats chest pain (angina). It works by relaxing blood vessels, which decreases the amount of work the heart has to do. It belongs to a group of medications called nitrates. This medicine may be used for other purposes; ask your health care provider or pharmacist if you have questions. COMMON BRAND NAME(S): Nitroquick, Nitrostat, Nitrotab What should I tell my care team before I take this medication? They need to know if you have any of these conditions: Anemia Head injury, recent stroke, or bleeding in the brain Liver disease Previous heart attack An unusual or allergic reaction to nitroglycerin, other medications, foods, dyes, or preservatives Pregnant or trying to get pregnant Breast-feeding How should I use this medication? Take this medication by mouth as needed. Use at the first sign of an angina attack (chest pain or tightness). You can also take this medication 5 to 10 minutes before an event likely to produce chest pain. Follow the directions exactly as written on the prescription label. Place one tablet under your tongue and let it dissolve. Do not swallow whole. Replace the dose if you accidentally swallow it. It will help if your mouth is not dry. Saliva around the tablet will help it to dissolve more quickly. Do not eat or drink, smoke or chew tobacco while a tablet is dissolving. Sit down when taking this medication. In an angina attack, you should feel better within 5 minutes after your first dose. You can take a dose every 5 minutes up to a total of 3 doses. If you do not feel better or feel worse after 1 dose, call 9-1-1 at once. Do not take more than 3 doses in 15 minutes. Your care team might give you other directions. Follow those directions if they do. Do not take your medication more often than directed. Talk to your care team about the use of this medication in children. Special care may be needed. Overdosage: If you think you have taken too much of this medicine contact a  poison control center or emergency room at once. NOTE: This medicine is only for you. Do not share this medicine with others. What if I miss a dose? This does not apply. This medication is only used as needed. What may interact with this medication? Do not take this medication with any of the following: Certain migraine medications like ergotamine and dihydroergotamine (DHE) Medications used to treat erectile dysfunction like sildenafil, tadalafil, and vardenafil Riociguat This medication may also interact with the following: Alteplase Aspirin Heparin Medications for high blood pressure Medications for mental depression Other medications used to treat angina Phenothiazines like chlorpromazine, mesoridazine, prochlorperazine, thioridazine This  list may not describe all possible interactions. Give your health care provider a list of all the medicines, herbs, non-prescription drugs, or dietary supplements you use. Also tell them if you smoke, drink alcohol, or use illegal drugs. Some items may interact with your medicine. What should I watch for while using this medication? Tell your care team if you feel your medication is no longer working. Keep this medication with you at all times. Sit or lie down when you take your medication to prevent falling if you feel dizzy or faint after using it. Try to remain calm. This will help you to feel better faster. If you feel dizzy, take several deep breaths and lie down with your feet propped up, or bend forward with your head resting between your knees. You may get drowsy or dizzy. Do not drive, use machinery, or do anything that needs mental alertness until you know how this medication affects you. Do not stand or sit up quickly, especially if you are an older patient. This reduces the risk of dizzy or fainting spells. Alcohol can make you more drowsy and dizzy. Avoid alcoholic drinks. Do not treat yourself for coughs, colds, or pain while you are taking  this medication without asking your care team for advice. Some ingredients may increase your blood pressure. What side effects may I notice from receiving this medication? Side effects that you should report to your care team as soon as possible: Allergic reactions--skin rash, itching, hives, swelling of the face, lips, tongue, or throat Headache, unusual weakness or fatigue, shortness of breath, nausea, vomiting, rapid heartbeat, blue skin or lips, which may be signs of methemoglobinemia Increased pressure around the brain--severe headache, blurry vision, change in vision, nausea, vomiting Low blood pressure--dizziness, feeling faint or lightheaded, blurry vision Slow heartbeat--dizziness, feeling faint or lightheaded, confusion, trouble breathing, unusual weakness or fatigue Worsening chest pain (angina)--pain, pressure, or tightness in the chest, neck, back, or arms Side effects that usually do not require medical attention (report to your care team if they continue or are bothersome): Dizziness Flushing Headache This list may not describe all possible side effects. Call your doctor for medical advice about side effects. You may report side effects to FDA at 1-800-FDA-1088. Where should I keep my medication? Keep out of the reach of children. Store at room temperature between 20 and 25 degrees C (68 and 77 degrees F). Store in [facility] [employer]. Protect from light and moisture. Keep tightly closed. Throw away any unused medication after the expiration date. NOTE: This sheet is a summary. It may not cover all possible information. If you have questions about this medicine, talk to your doctor, pharmacist, or health care provider.  2023 Elsevier/Gold Standard (2007-08-29 00:00:00)  Please keep a BP log for 2 weeks and send by MyChart or mail.  Blood Pressure Record Sheet To take your blood pressure, you will need a blood pressure machine. You can buy a blood pressure machine (blood pressure  monitor) at your clinic, drug store, or online. When choosing one, consider: An automatic monitor that has an arm cuff. A cuff that wraps snugly around your upper arm. You should be able to fit only one finger between your arm and the cuff. A device that stores blood pressure reading results. Do not choose a monitor that measures your blood pressure from your wrist or finger. Follow your health care provider's instructions for how to take your blood pressure. To use this form: Get one reading in the morning (a.m.) 1-2  hours after you take any medicines. Get one reading in the evening (p.m.) before supper. Write down the results in the spaces on this form. Repeat this once a week, or as told by your health care provider.  Make a follow-up appointment with your health care provider to discuss the results. Blood pressure log Date: _______________________ a.m. _____________________(1st reading) HR___________            p.m. _____________________(2nd reading) HR__________  Date: _______________________ a.m. _____________________(1st reading) HR___________            p.m. _____________________(2nd reading) HR__________ Date: _______________________ a.m. _____________________(1st reading) HR___________            p.m. _____________________(2nd reading) HR__________ Date: _______________________ a.m. _____________________(1st reading) HR___________            p.m. _____________________(2nd reading) HR__________  Date: _______________________ a.m. _____________________(1st reading) HR___________            p.m. _____________________(2nd reading) HR__________  Date: _______________________ a.m. _____________________(1st reading) HR___________            p.m. _____________________(2nd reading) HR__________  Date: _______________________ a.m. _____________________(1st reading) HR___________            p.m. _____________________(2nd reading) HR__________   This information is not  intended to replace advice given to you by your health care provider. Make sure you discuss any questions you have with your health care provider. Document Revised: 10/27/2019 Document Reviewed: 10/27/2019 Elsevier Patient Education  2021 [employer].

## 2022-03-12 NOTE — Progress Notes (Signed)
Cardiology Office Note:    Date:  03/12/2022   ID:  Lori Byrd, DOB 1950/08/10, MRN [id number]  PCP:  Marga Hoots, NP  Cardiologist:  Jenean Lindau, MD   Referring MD: Carvel Getting Key, *    ASSESSMENT:    1. Coronary artery disease involving native coronary artery of native heart without angina pectoris   2. 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 Obstructive sleep apnea   4. Primary hypertension   5. Obesity (BMI 35.0-39.9 without comorbidity)   6. DOE (dyspnea on exertion)   7. Cardiac murmur    PLAN:    In order of problems listed above:  Coronary artery disease: Secondary prevention stressed with the patient.  Importance of compliance with diet and medication stressed and she vocalized understanding.  Sublingual nitroglycerin prescription was sent, its protocol and 911 protocol explained and the patient vocalized understanding questions were answered to the patient's satisfaction Dyspnea on exertion: Patient has multiple reasons for this we will do a Lexiscan sestamibi or exercise Cardiolite to assess this and she is agreeable. Mixed dyslipidemia: Lipids are followed by primary care.  Patient on statin therapy and this is followed by primary care.  Labs reviewed from [facility] sheet and found to be fine Cardiac murmur: Echocardiogram will be done to assess murmur heard on auscultation. Carotid atherosclerosis post right-sided carotid endarterectomy soft bruits are noted bilaterally and we will do Dopplers to assess this. Obesity: Weight reduction stressed and diet emphasized.  She promises to do better. Essential hypertension: Blood pressure is elevated and she will keep a track of it and bring it for [place] in the next few days when she comes for a stress test. Patient was concerned about a tender area on the top of her scalp.  I checked it.   There is a small area of tenderness.  I talked to her about taking an appointment with her primary care to evaluate.  She vocalized understanding and agrees. Patient will be seen in follow-up appointment in 6 months or earlier if the patient has any concerns    Medication Adjustments/Labs and Tests Ordered: Current medicines are reviewed at length with the patient today.  Concerns regarding medicines are outlined above.  No orders of the defined types were placed in this encounter.  No orders of the defined types were placed in this encounter.    History of Present Illness:    Lori Byrd is a 71 y.o. female who is being seen today for the evaluation of coronary artery disease and to be established at the request of Carvel Getting Key, *.  Patient is a pleasant 71 year old female.  She has past medical history of coronary artery disease post stenting.  I do not have the details.  She is undergone stenting twice.  She has had right carotid endarterectomy.  She denies any chest pain orthopnea or PND.  She gives history of dyspnea on exertion and wants to be evaluated.  At the time of my evaluation, the patient is alert awake oriented and in no distress.  Past Medical History:  Diagnosis Date   Acquired thrombophilia ([place]) 05/30/2021   Anemia 07/08/2015   Anxiety    Arteriosclerosis of both carotid arteries 07/08/2015   Arthritis    B12 deficiency    Bilateral bunions 07/08/2015   Bilateral carotid artery stenosis 08/11/2018   BMI 36.0-36.9,adult  Cancer [facility])    skin cancer   Carotid artery stenosis    Chronic intermittent hypoxia with obstructive sleep apnea 06/22/2020   Chronic low back pain 07/08/2015   Chronic respiratory failure with hypoxia ([place]) 10/09/2021   Claudication ([place])    COPD (chronic obstructive pulmonary disease) (HCC)    COPD (chronic obstructive pulmonary disease) with chronic bronchitis ([place]) 11/10/2018   Corn or callus 08/28/2017   Coronary  arteriosclerosis 05/30/2021   Coronary artery disease    COVID-19 virus infection 11/21/2018   CPAP/BiPAP dependence 07/08/2015   Degenerative lumbar spinal stenosis 03/02/2020   Depression    Diabetes mellitus without complication ([place])    Diabetic polyneuropathy associated with type 2 diabetes mellitus ([place]) 11/10/2018   Fibromyalgia    Fibromyalgia    GERD (gastroesophageal reflux disease)    Heart disease    Hyperlipidemia associated with type 2 diabetes mellitus (HCC)    Hypertension    IBS (irritable bowel syndrome)    Innominate artery stenosis ([place]) 07/08/2015   Formatting of this note might be different from the original. Open stent in 11/2012 and restent in 07/2013   Intolerance of continuous positive airway pressure (CPAP) ventilation 11/10/2018   Lumbar radiculopathy 12/29/2020   Lumbar spondylosis 01/25/2021   Macular degeneration    Neuropathic pain of both feet 07/08/2015   Nodule of right lung 07/08/2015   Obesity 07/08/2015   Obstructive sleep apnea 02/19/2019   Osteopenia 12/08/2018   PAD (peripheral artery disease) ([place]) 11/10/2018   Peripheral vascular angioplasty status with implants and grafts 07/08/2015   Peripheral vascular disease ([place]) 03/02/2020   Primary fibromyalgia syndrome 03/02/2020   Recurrent insomnia 08/13/2019   Restless legs syndrome (RLS) 02/21/2016   Spondylolisthesis, grade 1 06/19/2020   Statin intolerance    Status post arterial stent 11/10/2018   Status post insertion of hypoglossal nerve stimulator 09/11/2021   Status post insertion of nerve stimulator 06/22/2020   Type 2 diabetes mellitus with diabetic mononeuropathy, without long-term current use of insulin ([place]) 12/05/2015   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, without long-term current use of insulin ([place]) 11/10/2018    Past Surgical History:  Procedure Laterality Date   CARDIAC CATHETERIZATION     DRUG INDUCED ENDOSCOPY N/A 02/19/2019   Procedure: DRUG  INDUCED SLEEP ENDOSCOPY;  Surgeon: Jerrell Belfast, MD;  Location: [place];  Service: ENT;  Laterality: N/A;   EYE SURGERY Bilateral    cataract   IMPLANTATION OF HYPOGLOSSAL NERVE STIMULATOR Right 06/30/2019   Procedure: IMPLANTATION OF HYPOGLOSSAL NERVE STIMULATOR;  Surgeon: Jerrell Belfast, MD;  Location: [place];  Service: ENT;  Laterality: Right;  Neck, chest and lower lateral chest   SKIN BIOPSY     SMALL INTESTINE SURGERY     TUBAL LIGATION     two carotid stent      Current Medications: Current Meds  Medication Sig   albuterol (VENTOLIN HFA) 108 (90 Base) MCG/ACT inhaler Inhale 1-2 puffs into the lungs every 6 (six) hours as needed.   amLODipine (NORVASC) 10 MG tablet Take 10 mg by mouth daily.   busPIRone (BUSPAR) 5 MG tablet Take 5 mg by mouth at bedtime.   busPIRone (BUSPAR) 5 MG tablet Take 5 mg by mouth every 8 (eight) hours as needed for anxiety.   clopidogrel (PLAVIX) 75 MG tablet Take 1 tablet (75 mg total) by mouth daily. Restart plavix on 07/03/19   Coenzyme Q10 (COQ10) 100 MG CAPS Take 100 mg  by mouth daily.   diclofenac Sodium (VOLTAREN) 1 % GEL Apply 1 Application topically as needed for pain.   ezetimibe (ZETIA) 10 MG tablet Take 10 mg by mouth daily.   FARXIGA 5 MG TABS tablet Take 5 mg by mouth every morning.   fexofenadine (ALLEGRA) 180 MG tablet Take 180 mg by mouth daily.   fluticasone (FLONASE) 50 MCG/ACT nasal spray Place 1 spray into both nostrils daily.   gabapentin (NEURONTIN) 100 MG capsule Take 200 mg by mouth at bedtime.   hydrochlorothiazide (HYDRODIURIL) 12.5 MG tablet Take 12.5 mg by mouth every morning.   meclizine (ANTIVERT) 25 MG tablet Take 25-50 mg by mouth 2 (two) times daily.   metoprolol succinate (TOPROL-XL) 100 MG 24 hr tablet Take 100 mg by mouth daily.   Multiple Vitamin (MULTI-VITAMIN PO) Take 1 tablet by mouth daily.    OZEMPIC, 0.25 OR 0.5 MG/DOSE, 2 MG/3ML SOPN Inject 0.25 mg into the skin once a week.    pantoprazole (PROTONIX) 40 MG tablet Take 40 mg by mouth every morning.    venlafaxine XR (EFFEXOR-XR) 75 MG 24 hr capsule Take 75 mg by mouth at bedtime.      Allergies:   Penicillins, Asa [aspirin], Toradol [ketorolac tromethamine], Morphine and related, and Tramadol   Social History   Socioeconomic History   Marital status: Unknown    Spouse name: Not on file   Number of children: Not on file   Years of education: Not on file   Highest education level: Not on file  Occupational History   Not on file  Tobacco Use   Smoking status: Former    Types: Cigarettes    Passive exposure: Past   Smokeless tobacco: Never  Vaping Use   Vaping Use: Never used  Substance and Sexual Activity   Alcohol use: Not Currently   Drug use: Never   Sexual activity: Not on file  Other Topics Concern   Not on file  Social History Narrative   Not on file   Social Determinants of Health   Financial Resource Strain: Not on file  Food Insecurity: Not on file  Transportation Needs: Not on file  Physical Activity: Not on file  Stress: Not on file  Social Connections: Not on file     Family History: The patient's family history includes Arthritis in her mother; Diabetes in her maternal grandmother, mother, and sister; Heart attack in her father; Heart failure in her mother; Hypertension in her mother; Pancreatic cancer in her brother; Skin cancer in her sister.  ROS:   Please see the history of present illness.    All other systems reviewed and are negative.  EKGs/Labs/Other Studies Reviewed:    The following studies were reviewed today: EKG reveals sinus rhythm and nonspecific ST-T changes   Recent Labs: 10/09/2021: BUN 16; Creatinine, Ser 0.99; Hemoglobin 13.2; Platelets 269.0; Potassium 4.1; Pro B Natriuretic peptide (BNP) 38.0; Sodium 137  Recent Lipid Panel No results found for: "CHOL", "TRIG", "HDL", "CHOLHDL", "VLDL", "LDLCALC", "LDLDIRECT"  Physical Exam:    VS:  BP (!) 160/70  (BP Location: Left Arm, Patient Position: Sitting)   Pulse 99   Ht '5\' 1"'$  (1.549 m)   Wt 188 lb 6.4 oz (85.5 kg)   SpO2 99%   BMI 35.60 kg/m     Wt Readings from Last 3 Encounters:  03/12/22 188 lb 6.4 oz (85.5 kg)  11/26/21 197 lb 12.8 oz (89.7 kg)  09/11/21 192 lb (87.1 kg)  GEN: Patient is in no acute distress HEENT: Normal NECK: No JVD; bilateral soft carotid bruits LYMPHATICS: No lymphadenopathy CARDIAC: S1 S2 regular, 2/6 systolic murmur at the apex. RESPIRATORY:  Clear to auscultation without rales, wheezing or rhonchi  ABDOMEN: Soft, non-tender, non-distended MUSCULOSKELETAL:  No edema; No deformity  SKIN: Warm and dry NEUROLOGIC:  Alert and oriented x 3 PSYCHIATRIC:  Normal affect    Signed, Jenean Lindau, MD  03/12/2022 3:25 PM    [facility]

## 2022-03-19 DIAGNOSIS — J449 Chronic obstructive pulmonary disease, unspecified: Secondary | ICD-10-CM | POA: Diagnosis not present

## 2022-03-20 ENCOUNTER — Telehealth: Payer: Self-pay | Admitting: *Deleted

## 2022-03-20 ENCOUNTER — Encounter: Payer: Self-pay | Admitting: *Deleted

## 2022-03-20 ENCOUNTER — Telehealth: Payer: Self-pay | Admitting: Neurology

## 2022-03-20 DIAGNOSIS — I152 Hypertension secondary to endocrine disorders: Secondary | ICD-10-CM | POA: Diagnosis not present

## 2022-03-20 DIAGNOSIS — E1159 Type 2 diabetes mellitus with other circulatory complications: Secondary | ICD-10-CM | POA: Diagnosis not present

## 2022-03-20 NOTE — Telephone Encounter (Signed)
Left message on voicemail in reference to upcoming appointment scheduled for 03/26/22. Phone number given for a call back so details instructions can be given. [place] Mychart letter sent

## 2022-03-20 NOTE — Telephone Encounter (Signed)
Patient given detailed instructions per Myocardial Perfusion Study Information Sheet for the test on 03/26/22 at 0800. Patient notified to arrive 15 minutes early and that it is imperative to arrive on time for appointment to keep from having the test rescheduled.  If you need to cancel or reschedule your appointment, please call the office within 24 hours of your appointment. . Patient verbalized understanding.Almarie Kurdziel, [place]e [place]

## 2022-03-20 NOTE — Telephone Encounter (Signed)
[employer]- [employer]: [employer] medicare/medicaid no auth req.  Patient is scheduled at [facility] for 04/21/22 at 8 pm.  Mailed packet to the patient.

## 2022-03-26 ENCOUNTER — Ambulatory Visit (INDEPENDENT_AMBULATORY_CARE_PROVIDER_SITE_OTHER): Payer: Medicare Other

## 2022-03-26 ENCOUNTER — Ambulatory Visit: Payer: Medicare Other | Attending: Cardiology

## 2022-03-26 DIAGNOSIS — I2510 Atherosclerotic heart disease of native coronary artery without angina pectoris: Secondary | ICD-10-CM | POA: Diagnosis not present

## 2022-03-26 DIAGNOSIS — R0989 Other specified symptoms and signs involving the circulatory and respiratory systems: Secondary | ICD-10-CM | POA: Diagnosis not present

## 2022-03-26 DIAGNOSIS — R0609 Other forms of dyspnea: Secondary | ICD-10-CM | POA: Diagnosis not present

## 2022-03-26 MED ORDER — TECHNETIUM TC 99M TETROFOSMIN IV KIT
10.1000 | PACK | Freq: Once | INTRAVENOUS | Status: AC | PRN
  Administered 2022-03-26: 10.1 via INTRAVENOUS

## 2022-03-26 MED ORDER — REGADENOSON 0.4 MG/5ML IV SOLN
0.4000 mg | Freq: Once | INTRAVENOUS | Status: AC
  Administered 2022-03-26: 0.4 mg via INTRAVENOUS

## 2022-03-26 MED ORDER — TECHNETIUM TC 99M TETROFOSMIN IV KIT
29.1000 | PACK | Freq: Once | INTRAVENOUS | Status: AC | PRN
  Administered 2022-03-26: 29.1 via INTRAVENOUS

## 2022-03-27 LAB — MYOCARDIAL PERFUSION IMAGING
LV dias vol: 62 mL (ref 46–106)
LV sys vol: 17 mL
Nuc Stress EF: 72 %
Peak HR: 98 {beats}/min
Rest HR: 76 {beats}/min
Rest Nuclear Isotope Dose: 10.1 mCi
SDS: 8
SRS: 5
SSS: 13
Stress Nuclear Isotope Dose: 29.1 mCi
TID: 1.17

## 2022-03-28 ENCOUNTER — Telehealth: Payer: Self-pay | Admitting: Cardiology

## 2022-03-28 DIAGNOSIS — H04129 Dry eye syndrome of unspecified lacrimal gland: Secondary | ICD-10-CM | POA: Diagnosis not present

## 2022-03-28 NOTE — Telephone Encounter (Signed)
Results reviewed with pt as per Dr. Revankar's note.  Pt verbalized understanding and had no additional questions. Routed to PCP.  

## 2022-03-28 NOTE — Telephone Encounter (Signed)
Patient called for her stress test results.

## 2022-03-29 ENCOUNTER — Ambulatory Visit (INDEPENDENT_AMBULATORY_CARE_PROVIDER_SITE_OTHER): Payer: Medicare Other | Admitting: Adult Health

## 2022-03-29 ENCOUNTER — Telehealth: Payer: Self-pay | Admitting: Cardiology

## 2022-03-29 ENCOUNTER — Encounter: Payer: Self-pay | Admitting: Adult Health

## 2022-03-29 VITALS — BP 130/70 | HR 89 | Temp 98.4°F | Ht 61.0 in | Wt 186.8 lb

## 2022-03-29 DIAGNOSIS — G4733 Obstructive sleep apnea (adult) (pediatric): Secondary | ICD-10-CM | POA: Diagnosis not present

## 2022-03-29 DIAGNOSIS — R9389 Abnormal findings on diagnostic imaging of other specified body structures: Secondary | ICD-10-CM

## 2022-03-29 DIAGNOSIS — J9611 Chronic respiratory failure with hypoxia: Secondary | ICD-10-CM | POA: Diagnosis not present

## 2022-03-29 MED ORDER — ALBUTEROL SULFATE HFA 108 (90 BASE) MCG/ACT IN AERS: 1.0000 | INHALATION_SPRAY | Freq: Four times a day (QID) | RESPIRATORY_TRACT | 2 refills | Status: DC | PRN

## 2022-03-29 NOTE — Patient Instructions (Signed)
Continue on inspire at bedtime Wear Oxygen 2 L with activity and at bedtime Set up for HRCT Chest .  Albuterol inhaler 1-2 puffs every 4-6hr as needed.  Follow up with Dr. Elsworth Soho or Jennel Mara NP in 4 months and As needed   Please contact office for sooner follow up if symptoms do not improve or worsen or seek emergency care

## 2022-03-29 NOTE — Assessment & Plan Note (Signed)
Chronic respiratory failure with exertional hypoxia and nocturnal hypoxemia.  She had an extensive work-up looking at the source of this.  PFTs show no airflow obstruction or restriction.  She does have a decreased diffusing capacity.  Echo was negative for pulmonary artery hypertension..  2D echo with bubble study was negative for shunt/PFO.  D-dimer was negative. Stress Myoview negative. We will set patient up for high-resolution CT chest to rule out possible underlying interstitial lung process.  She has no significant cough    Plan  . Patient Instructions  Continue on inspire at bedtime Wear Oxygen 2 L with activity and at bedtime Set up for HRCT Chest .  Albuterol inhaler 1-2 puffs every 4-6hr as needed.  Follow up with Dr. Elsworth Soho or Liliah Dorian NP in 4 months and As needed   Please contact office for sooner follow up if symptoms do not improve or worsen or seek emergency care

## 2022-03-29 NOTE — Progress Notes (Signed)
$'@Patient'T$  ID: Lori Byrd, female    DOB: 1951-06-13, 71 y.o.   MRN: [id number]  Chief Complaint  Patient presents with   Follow-up    Referring provider: Carvel Getting Key, *  HPI: 71 year old female former smoker seen for pulmonary consult October 09, 2021 for nocturnal hypoxemia Medical history significant for sleep apnea-CPAP intolerant status post the inspire device implantation 2021   TEST/EVENTS :  Home sleep study January 18, 2019 showed severe sleep apnea with AHI at 53/hour with frequent episodes of hypoxemia sleep apnea is worst in the prone position with AHI at 65.8/hour  2 D echo 10/24/21 -2D echo shows some mild grade 1 diastolic dysfunction-this is some mild stiffness of the heart.  Pulm function is normal.  Normal pulmonary artery systolic pressure.  Mild thickening of the mitral valve no stenosis.  No regurg. RV size is mildly enlarged and mildly reduced systolic function. (D Dimer was neg)   2D echo with bubble study Dec 06, 2021 negative.  Stress Myoview March 26, 2022 normal, low risk study, EF 72%.  PFT Nov 26, 2021 showed FEV1 at 96%, ratio 84, FVC 87%, no significant bronchodilator response, DLCO 67%.  ONO showed significant desat > 4h   03/29/2022  Follow up : OSA , chronic respiratory failure Patient presents for a 54-monthfollow-up.  Patient has underlying severe sleep apnea.  She was CPAP intolerant and underwent the inspire device implantation in 2020 once.  She had improvement in symptom burden with decreased daytime sleepiness and improved sleep hygiene.  However sleep titration study showed ongoing nocturnal hypoxemia.  Patient was seen for pulmonary consult in March and found to have exertional hypoxemia.  Overnight oximetry test was set up and showed positive nocturnal hypoxemia despite great compliance on inspire device.  Patient was started on oxygen 2 L with activity and at bedtime. She has had an extensive work-up looking for the source  of ongoing hypoxemia.  PFTs showed normal lung function.  Diffusing capacity was decreased at 67%.  She had a 2D echo in April that showed mild grade 1 diastolic dysfunction.  Normal pulmonary artery systolic pressures.  D-dimer was negative.  She had a 2D echo with bubble study that was negative in May.  She underwent a stress Myoview earlier this week that showed normal low risk study with a EF at 72%.  Patient says she does not wear her oxygen with activity.  If she gets doing heavy things sometimes she will put it on.  She says that she does get short of breath with activities.  She is wearing her oxygen at bedtime.  Has had some trouble with extra water in her oxygen tubing.  We are contacting her DME company for assistance     Allergies  Allergen Reactions   Penicillins Anaphylaxis    Did it involve swelling of the face/tongue/throat, SOB, or low BP? Yes Did it involve sudden or severe rash/hives, skin peeling, or any reaction on the inside of your mouth or nose? Yes Did you need to seek medical attention at a hospital or doctor's office? Yes When did it last happen? childhood      If all above answers are "NO", may proceed with cephalosporin use.    Asa [Aspirin] Other (See Comments)    Per Dr not to take; on Plavix   Toradol [Ketorolac Tromethamine] Other (See Comments)    Told by MD not to take; on Plavix    Morphine And Related Other (See  Comments)    Pt preference    Tramadol Other (See Comments)    Pt does not like how this makes her feel    Immunization History  Administered Date(s) Administered   Fluad Quad(high Dose 65+) 05/21/2021   Influenza, High Dose Seasonal PF 05/05/2019, 05/31/2019   Pneumococcal Conjugate-13 04/19/2015, 05/26/2017   Tdap 04/19/2015   Zoster Recombinat (Shingrix) 04/19/2018, 08/22/2018   Zoster, Live 11/18/2012    Past Medical History:  Diagnosis Date   Acquired thrombophilia ([place]) 05/30/2021   Anemia 07/08/2015   Anxiety     Arteriosclerosis of both carotid arteries 07/08/2015   Arthritis    B12 deficiency    Bilateral bunions 07/08/2015   Bilateral carotid artery stenosis 08/11/2018   BMI 36.0-36.9,adult    Cancer (HCC)    skin cancer   Carotid artery stenosis    Chronic intermittent hypoxia with obstructive sleep apnea 06/22/2020   Chronic low back pain 07/08/2015   Chronic respiratory failure with hypoxia (HCC) 10/09/2021   Claudication ([place])    COPD (chronic obstructive pulmonary disease) (HCC)    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/10/2018   Corn or callus 08/28/2017   Coronary arteriosclerosis 05/30/2021   Coronary artery disease    COVID-19 virus infection 11/21/2018   CPAP/BiPAP dependence 07/08/2015   Degenerative lumbar spinal stenosis 03/02/2020   Depression    Diabetes mellitus without complication ([place])    Diabetic polyneuropathy associated with type 2 diabetes mellitus ([place]) 11/10/2018   Fibromyalgia    Fibromyalgia    GERD (gastroesophageal reflux disease)    Heart disease    Hyperlipidemia associated with type 2 diabetes mellitus ([place])    Hypertension    IBS (irritable bowel syndrome)    Innominate artery stenosis ([place]) 07/08/2015   Formatting of this note might be different from the original. Open stent in 11/2012 and restent in 07/2013   Intolerance of continuous positive airway pressure (CPAP) ventilation 11/10/2018   Lumbar radiculopathy 12/29/2020   Lumbar spondylosis 01/25/2021   Macular degeneration    Neuropathic pain of both feet 07/08/2015   Nodule of right lung 07/08/2015   Obesity 07/08/2015   Obstructive sleep apnea 02/19/2019   Osteopenia 12/08/2018   PAD (peripheral artery disease) ([place]) 11/10/2018   Peripheral vascular angioplasty status with implants and grafts 07/08/2015   Peripheral vascular disease ([place]) 03/02/2020   Primary fibromyalgia syndrome 03/02/2020   Recurrent insomnia 08/13/2019   Restless legs syndrome (RLS) 02/21/2016    Spondylolisthesis, grade 1 06/19/2020   Statin intolerance    Status post arterial stent 11/10/2018   Status post insertion of hypoglossal nerve stimulator 09/11/2021   Status post insertion of nerve stimulator 06/22/2020   Type 2 diabetes mellitus with diabetic mononeuropathy, without long-term current use of insulin ([place]) 12/05/2015   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     busPIRone (BUSPAR) 5 MG tablet Take 5 mg by mouth at bedtime.     busPIRone (BUSPAR) 5 MG tablet Take 5 mg by mouth every 8 (eight) hours as needed for anxiety.  clopidogrel (PLAVIX) 75 MG tablet Take 1 tablet (75 mg total) by mouth daily. Restart plavix on 07/03/19 30 tablet 12   Coenzyme Q10 (COQ10) 100 MG CAPS Take 100 mg by mouth daily.     diclofenac Sodium (VOLTAREN) 1 % GEL Apply 1 Application topically as needed for pain.     ezetimibe (ZETIA) 10 MG tablet Take 10 mg by mouth daily.     FARXIGA 5 MG TABS tablet Take 5 mg by mouth every morning.     fexofenadine (ALLEGRA) 180 MG tablet Take 180 mg by mouth daily.     fluticasone (FLONASE) 50 MCG/ACT nasal spray Place 1 spray into both nostrils daily.     gabapentin (NEURONTIN) 100 MG capsule Take 200 mg by mouth at bedtime.     hydrochlorothiazide (HYDRODIURIL) 12.5 MG tablet Take 12.5 mg by mouth every morning.     meclizine (ANTIVERT) 25 MG tablet Take 25-50 mg by mouth 2 (two) times daily.     metoprolol succinate (TOPROL-XL) 100 MG 24 hr tablet Take 100 mg by mouth daily.     Multiple Vitamin (MULTI-VITAMIN PO) Take 1 tablet by mouth daily.       nitroGLYCERIN (NITROSTAT) 0.4 MG SL tablet Place 1 tablet (0.4 mg total) under the tongue every 5 (five) minutes as needed. 25 tablet 6   OZEMPIC, 0.25 OR 0.5 MG/DOSE, 2 MG/3ML SOPN Inject 0.25 mg into the skin once a week.     pantoprazole (PROTONIX) 40 MG tablet Take 40 mg by mouth every morning.      venlafaxine XR (EFFEXOR-XR) 75 MG 24 hr capsule Take 75 mg by mouth at bedtime.      albuterol (VENTOLIN HFA) 108 (90 Base) MCG/ACT inhaler Inhale 1-2 puffs into the lungs every 6 (six) hours as needed. 8 g 2   No facility-administered medications prior to visit.     Review of Systems:   Constitutional:   No  weight loss, night sweats,  Fevers, chills,  +fatigue, or  lassitude.  HEENT:   No headaches,  Difficulty swallowing,  Tooth/dental problems, or  Sore throat,                No sneezing, itching, ear ache, nasal congestion, post nasal drip,   CV:  No chest pain,  Orthopnea, PND, swelling in lower extremities, anasarca, dizziness, palpitations, syncope.   GI  No heartburn, indigestion, abdominal pain, nausea, vomiting, diarrhea, change in bowel habits, loss of appetite, bloody stools.   Resp: .  No chest wall deformity  Skin: no rash or lesions.  GU: no dysuria, change in color of urine, no urgency or frequency.  No flank pain, no hematuria   MS:  No joint pain or swelling.  No decreased range of motion.  No back pain.    Physical Exam  BP 130/70 (BP Location: Left Arm, Patient Position: Sitting, Cuff Size: Large)   Pulse 89   Temp 98.4 F (36.9 C) (Oral)   Ht '5\' 1"'$  (1.549 m)   Wt 186 lb 12.8 oz (84.7 kg)   SpO2 96%   BMI 35.30 kg/m   GEN: A/Ox3; pleasant , NAD, well nourished    HEENT:  [place]/AT,  , NOSE-clear, THROAT-clear, no lesions, no postnasal drip or exudate noted.   NECK:  Supple w/ fair ROM; no JVD; normal carotid impulses w/o bruits; no thyromegaly or nodules palpated; no lymphadenopathy.    RESP  Clear  P & A; w/o, wheezes/ rales/ or rhonchi. no accessory  muscle use, no dullness to percussion  CARD:  RRR, no m/r/g, no peripheral edema, pulses intact, no cyanosis or clubbing.  GI:   Soft & nt; nml bowel sounds; no organomegaly or masses detected.   Musco: Warm bil, no deformities or joint swelling noted.   Neuro: alert, no focal deficits noted.    Skin: Warm, no lesions or rashes    Lab Results:  CBC    Component Value Date/Time   WBC 4.5 10/09/2021 1157   RBC 4.63 10/09/2021 1157   HGB 13.2 10/09/2021 1157   HCT 40.5 10/09/2021 1157   PLT 269.0 10/09/2021 1157   MCV 87.5 10/09/2021 1157   MCH 29.2 06/28/2019 0820   MCHC 32.7 10/09/2021 1157   RDW 14.9 10/09/2021 1157   LYMPHSABS 1.6 10/09/2021 1157   MONOABS 0.4 10/09/2021 1157   EOSABS 0.2 10/09/2021 1157   BASOSABS 0.0 10/09/2021 1157    BMET    Component Value Date/Time   NA 137 10/09/2021 1157   K 4.1 10/09/2021 1157   CL 97 10/09/2021 1157   CO2 30 10/09/2021 1157   GLUCOSE 103 (H) 10/09/2021 1157   BUN 16 10/09/2021 1157   CREATININE 0.99 10/09/2021 1157   CALCIUM 10.0 10/09/2021 1157   GFRNONAA >60 06/28/2019 0820   GFRAA >60 06/28/2019 0820    BNP    Component Value Date/Time   BNP 18.7 11/22/2018 0830    ProBNP    Component Value Date/Time   PROBNP 38.0 10/09/2021 1157    Imaging: MYOCARDIAL PERFUSION IMAGING  Result Date: 03/27/2022   The study is normal. The study is low risk.   Left ventricular function is normal. Nuclear stress EF: 72 %. The left ventricular ejection fraction is hyperdynamic (>65%). End diastolic cavity size is normal.   Prior study not available for comparison.    regadenoson (LEXISCAN) injection SOLN 0.4 mg     Date Action Dose Route User   03/26/2022 1025 Given 0.4 mg Intravenous Janalyn Shy E      technetium tetrofosmin (TC-MYOVIEW) injection 75.6 millicurie     Date Action Dose Route User   03/26/2022 0840 Contrast Given 43.3 millicurie Intravenous Janalyn Shy E      technetium tetrofosmin (TC-MYOVIEW)  injection 29.5 millicurie     Date Action Dose Route User   03/26/2022 1025 Contrast Given 18.8 millicurie Intravenous Varner, Gina E          Latest Ref Rng & Units 11/26/2021    1:10 PM  PFT Results  FVC-Pre L 2.31   FVC-Predicted Pre % 87   FVC-Post L 2.30   FVC-Predicted Post % 87   Pre FEV1/FVC % % 82   Post FEV1/FCV % % 84   FEV1-Pre L 1.90   FEV1-Predicted Pre % 95   FEV1-Post L 1.92   DLCO uncorrected ml/min/mmHg 11.80   DLCO UNC% % 67   DLCO corrected ml/min/mmHg 11.87   DLCO COR %Predicted % 67   DLVA Predicted % 83   TLC L 4.02   TLC % Predicted % 87   RV % Predicted % 81     No results found for: "NITRICOXIDE"      Assessment & Plan:   No problem-specific Assessment & Plan notes found for this encounter.     Rexene Edison, NP 03/29/2022

## 2022-03-29 NOTE — Assessment & Plan Note (Signed)
Reported good compliance and perceived clinical benefit on inspire device.  Continue inspire at bedtime. She is followed by neurology with Dr. Brett Fairy.   Plan  Patient Instructions  Continue on inspire at bedtime Wear Oxygen 2 L with activity and at bedtime Set up for HRCT Chest .  Albuterol inhaler 1-2 puffs every 4-6hr as needed.  Follow up with Dr. Elsworth Soho or Timiyah Romito NP in 4 months and As needed   Please contact office for sooner follow up if symptoms do not improve or worsen or seek emergency care

## 2022-03-29 NOTE — Telephone Encounter (Signed)
Advised that the results have not been reviewed at this time.

## 2022-03-29 NOTE — Telephone Encounter (Signed)
Patient called to follow-up on test results. 

## 2022-04-01 ENCOUNTER — Telehealth: Payer: Self-pay | Admitting: Cardiology

## 2022-04-01 DIAGNOSIS — R079 Chest pain, unspecified: Secondary | ICD-10-CM

## 2022-04-01 DIAGNOSIS — R0609 Other forms of dyspnea: Secondary | ICD-10-CM

## 2022-04-01 DIAGNOSIS — I771 Stricture of artery: Secondary | ICD-10-CM

## 2022-04-01 DIAGNOSIS — I2510 Atherosclerotic heart disease of native coronary artery without angina pectoris: Secondary | ICD-10-CM

## 2022-04-01 NOTE — Telephone Encounter (Signed)
Patient calling in for ultrasound results. Please advise

## 2022-04-01 NOTE — Telephone Encounter (Signed)
Pt aware.

## 2022-04-01 NOTE — Telephone Encounter (Signed)
-----   Message from Jenean Lindau, MD sent at 04/01/2022  9:48 AM EDT ----- Get CT scan of the chest with contrast CT coronary angiogram to assess these arteries in question which is the brachiocephalic etc.  Copy primary care Jenean Lindau, MD 04/01/2022 9:47 AM

## 2022-04-03 NOTE — Telephone Encounter (Signed)
I called and left a voicemail for the patient to call back to r/s her inspire sleep study due to it has to be scheduled on a day that Lori Byrd is working.

## 2022-04-03 NOTE — Telephone Encounter (Signed)
Patient returned my call.  She is r/s for 04/25/22 at 8 pm.  Mailed new packet to the patient.

## 2022-04-09 ENCOUNTER — Encounter: Payer: Self-pay | Admitting: Cardiology

## 2022-04-10 ENCOUNTER — Telehealth: Payer: Self-pay | Admitting: Adult Health

## 2022-04-10 DIAGNOSIS — I2510 Atherosclerotic heart disease of native coronary artery without angina pectoris: Secondary | ICD-10-CM | POA: Diagnosis not present

## 2022-04-10 DIAGNOSIS — I771 Stricture of artery: Secondary | ICD-10-CM | POA: Diagnosis not present

## 2022-04-10 DIAGNOSIS — J8410 Pulmonary fibrosis, unspecified: Secondary | ICD-10-CM | POA: Diagnosis not present

## 2022-04-10 DIAGNOSIS — J929 Pleural plaque without asbestos: Secondary | ICD-10-CM | POA: Diagnosis not present

## 2022-04-10 DIAGNOSIS — J984 Other disorders of lung: Secondary | ICD-10-CM | POA: Diagnosis not present

## 2022-04-10 NOTE — Telephone Encounter (Signed)
Called and spoke with patient and she advised me that she just had a CT scan done this week. She is going to get a copy of the CT for [place] and drop it at the office. But I told her to wait till we get the disk and for Tammy to say to cancel CT that is scheduled next month.   Patient understood.  TP please advise and be aware

## 2022-04-11 NOTE — Telephone Encounter (Signed)
Called and spoke with patient. She verbalized understanding. I was able to find the report in PACS. Her patient ID in PACS is [id number].   PCCs, can we cancel the CT that is scheduled for 10/02? Thanks!

## 2022-04-11 NOTE — Telephone Encounter (Signed)
Yes I guess that will be fine . Will need to get copy of report , it is not in our system  Can cancel one on 10/2

## 2022-04-15 ENCOUNTER — Telehealth: Payer: Self-pay | Admitting: Cardiology

## 2022-04-15 NOTE — Telephone Encounter (Signed)
Spoke with pt about CT results. Test completed at [facility]. Per Dr.Revankar her test results were good. Pt verbalized understanding and had no further questions.

## 2022-04-15 NOTE — Telephone Encounter (Signed)
Patient is requesting to discuss CT results.

## 2022-04-19 DIAGNOSIS — J449 Chronic obstructive pulmonary disease, unspecified: Secondary | ICD-10-CM | POA: Diagnosis not present

## 2022-04-22 ENCOUNTER — Other Ambulatory Visit: Payer: Medicare Other

## 2022-04-22 NOTE — Telephone Encounter (Signed)
Report printed and given to TP.

## 2022-04-22 NOTE — Telephone Encounter (Signed)
Sorry can you get me the report copy I could not find .

## 2022-04-23 NOTE — Telephone Encounter (Signed)
CT chest done April 10, 2022 showed stable bilateral subpleural groundglass opacifications, reticular opacities, cystic and honeycombing changes in the upper lungs.  Additional groundglass and septal thickening in the lower lobes.  And a 4 mm right lower lobe nodule. This type of scarring could be what is causing some of her shortness of breath.  We will discuss in full detail at follow-up visit. Going forward can consider a high-resolution CT chest in 4 to 6 months if indicated. Make sure she has an appointment with Dr. Elsworth Soho in about 6 to 8 weeks for follow-up

## 2022-04-25 NOTE — Telephone Encounter (Signed)
Patient called and r/s for 06/02/22 at 9 pm.

## 2022-04-29 NOTE — Telephone Encounter (Signed)
Called and spoke with patient, advised of results/recommendations per Rexene Edison NP.  Scheduled OV with Tammy Parrett NP as no availability with Dr. Elsworth Soho in 6-8 weeks.  She verbalized understanding.  Nothing further needed.

## 2022-04-30 DIAGNOSIS — H353132 Nonexudative age-related macular degeneration, bilateral, intermediate dry stage: Secondary | ICD-10-CM | POA: Diagnosis not present

## 2022-05-03 DIAGNOSIS — R0981 Nasal congestion: Secondary | ICD-10-CM | POA: Diagnosis not present

## 2022-05-03 DIAGNOSIS — R051 Acute cough: Secondary | ICD-10-CM | POA: Diagnosis not present

## 2022-05-03 DIAGNOSIS — R509 Fever, unspecified: Secondary | ICD-10-CM | POA: Diagnosis not present

## 2022-05-07 DIAGNOSIS — E538 Deficiency of other specified B group vitamins: Secondary | ICD-10-CM | POA: Diagnosis not present

## 2022-05-19 DIAGNOSIS — J449 Chronic obstructive pulmonary disease, unspecified: Secondary | ICD-10-CM | POA: Diagnosis not present

## 2022-05-21 DIAGNOSIS — Z23 Encounter for immunization: Secondary | ICD-10-CM | POA: Diagnosis not present

## 2022-05-28 NOTE — Telephone Encounter (Signed)
Sleep lab manager and Star Age, MD have discussed the sleep study that is scheduled for 06/02/22. Due to patient's insomnia and the nature of her study, we feel it is best that patient do a home sleep study. The results of the HST will be most accurate due to her most likely sleeping better in her own home, using the Inspire and home O2 as well.  Once we have results for the HST, then an in office appt will be made and will discuss the results with the patient. At that time, if the HST shows that the pt is not getting optimal treatment of her OSA, then changes will be able to be made. Cancelling inlab study for 06/02/22.   Please change order to a HST. Please let patient know that the order has been changed and we will be calling her soon to schedule that appt. Thanks.

## 2022-05-28 NOTE — Telephone Encounter (Signed)
Called pt at [phone]. LVM for her to call.

## 2022-06-01 DIAGNOSIS — E785 Hyperlipidemia, unspecified: Secondary | ICD-10-CM | POA: Diagnosis not present

## 2022-06-01 DIAGNOSIS — E1169 Type 2 diabetes mellitus with other specified complication: Secondary | ICD-10-CM | POA: Diagnosis not present

## 2022-06-01 DIAGNOSIS — E119 Type 2 diabetes mellitus without complications: Secondary | ICD-10-CM | POA: Diagnosis not present

## 2022-06-01 DIAGNOSIS — E1159 Type 2 diabetes mellitus with other circulatory complications: Secondary | ICD-10-CM | POA: Diagnosis not present

## 2022-06-01 DIAGNOSIS — I152 Hypertension secondary to endocrine disorders: Secondary | ICD-10-CM | POA: Diagnosis not present

## 2022-06-01 DIAGNOSIS — E559 Vitamin D deficiency, unspecified: Secondary | ICD-10-CM | POA: Diagnosis not present

## 2022-06-01 DIAGNOSIS — R5383 Other fatigue: Secondary | ICD-10-CM | POA: Diagnosis not present

## 2022-06-01 DIAGNOSIS — E538 Deficiency of other specified B group vitamins: Secondary | ICD-10-CM | POA: Diagnosis not present

## 2022-06-04 ENCOUNTER — Ambulatory Visit: Payer: Medicare Other | Admitting: Cardiology

## 2022-06-05 ENCOUNTER — Encounter: Payer: Self-pay | Admitting: Pharmacist

## 2022-06-05 ENCOUNTER — Other Ambulatory Visit: Payer: Self-pay

## 2022-06-05 NOTE — Progress Notes (Signed)
[facility])                                            [place] Team                                        Statin Quality Measure Assessment    06/05/2022  Lori Byrd 27-Oct-1950 [id number]   I am a [facility] clinical pharmacist that reviews patients for statin quality initiatives.     Per review of chart and payor information, [name] has a diagnosis of cardiovascular disease but is not currently filling a statin prescription.  This places her into the [facility] (Statin Use In Patients with Cardiovascular Disease) measure for CMS.    Patient has documented trials of statins with reported headache, but no corresponding CPT codes that would exclude patient from [facility] measure.  If clinically appropriate, please evaluate her for a statin re-challenge or code for past statin intolerance at tomorrow's office visit.   Please consider ONE of the following recommendations:  Initiate high intensity statin Atorvastatin '40mg'$  once daily, #90, 3 refills   Rosuvastatin '20mg'$  once daily, #90, 3 refills    Initiate moderate intensity  statin with reduced frequency if prior  statin intolerance 1x weekly, #13, 3 refills   2x weekly, #26, 3 refills   3x weekly, #39, 3 refills    Code for past statin intolerance  (required annually)  Provider Requirements: Must asociate code during an office visit or telehealth encounter   Drug Induced Myopathy G72.0   Myalgia M79.1   Myositis, unspecified M60.9   Myopathy, unspecified G72.9   Rhabdomyolysis M62.82     Thank you for your time and consideration, Curlene Labrum, PharmD [place] Pharmacist Office: [phone]

## 2022-06-06 ENCOUNTER — Ambulatory Visit: Payer: Medicare Other | Attending: Cardiology | Admitting: Cardiology

## 2022-06-06 ENCOUNTER — Encounter: Payer: Self-pay | Admitting: Cardiology

## 2022-06-06 VITALS — BP 136/58 | HR 82 | Ht 61.0 in | Wt 188.8 lb

## 2022-06-06 DIAGNOSIS — G4733 Obstructive sleep apnea (adult) (pediatric): Secondary | ICD-10-CM

## 2022-06-06 DIAGNOSIS — I2510 Atherosclerotic heart disease of native coronary artery without angina pectoris: Secondary | ICD-10-CM | POA: Diagnosis not present

## 2022-06-06 DIAGNOSIS — E1142 Type 2 diabetes mellitus with diabetic polyneuropathy: Secondary | ICD-10-CM

## 2022-06-06 DIAGNOSIS — I6523 Occlusion and stenosis of bilateral carotid arteries: Secondary | ICD-10-CM | POA: Diagnosis not present

## 2022-06-06 DIAGNOSIS — I10 Essential (primary) hypertension: Secondary | ICD-10-CM

## 2022-06-06 DIAGNOSIS — J431 Panlobular emphysema: Secondary | ICD-10-CM | POA: Diagnosis not present

## 2022-06-06 NOTE — Patient Instructions (Signed)

## 2022-06-06 NOTE — Progress Notes (Signed)
Cardiology Office Note:    Date:  06/06/2022   ID:  Lori Byrd, DOB 1951/03/27, MRN [id number]  PCP:  Marga Hoots, NP  Cardiologist:  Jenean Lindau, MD   Referring MD: Carvel Getting Key, *    ASSESSMENT:    1. Bilateral carotid artery stenosis   2. Coronary artery disease involving native coronary artery of native heart without angina pectoris   3. Primary hypertension   4. Panlobular emphysema ([place])   5. Obstructive sleep apnea   6. Diabetic polyneuropathy associated with type 2 diabetes mellitus (HCC)    PLAN:    In order of problems listed above:  Coronary artery disease: Secondary prevention stressed with the patient.  Importance of compliance with diet medication stressed and she vocalized understanding.  She was advised to ambulate to the best of her ability. Essential hypertension: Blood pressure stable and diet was emphasized.  Lifestyle modification urged. Mixed dyslipidemia: On lipid-lowering medications.  Lipids followed by primary care.  Lipids were reviewed from [facility] sheet and discussed with patient.  She is doing well with this. Obstructive sleep apnea: Sleep health issues were discussed. Diabetes mellitus and obesity: Lifestyle modification urged weight reduction stressed and she promises to do better. Patient will be seen in follow-up appointment in 12 months or earlier if the patient has any concerns    Medication Adjustments/Labs and Tests Ordered: Current medicines are reviewed at length with the patient today.  Concerns regarding medicines are outlined above.  No orders of the defined types were placed in this encounter.  No orders of the defined types were placed in this encounter.    No chief complaint on file.    History of Present Illness:    Lori Byrd is a 71 y.o. female.  Patient has past medical history of coronary artery disease, essential hypertension, mixed dyslipidemia and obesity.  She denies  any problems at this time and takes care of activities of daily living.  No chest pain orthopnea or PND.  At the time of my evaluation, the patient is alert awake oriented and in no distress.  She ambulates to the best of her ability.  Past Medical History:  Diagnosis Date   Acquired thrombophilia ([place]) 05/30/2021   Anemia 07/08/2015   Anxiety    Arteriosclerosis of both carotid arteries 07/08/2015   Arthritis    B12 deficiency    Bilateral bunions 07/08/2015   Bilateral carotid artery stenosis 08/11/2018   BMI 36.0-36.9,adult    Cancer (HCC)    skin cancer   Cardiac murmur 03/12/2022   Carotid artery stenosis    Chronic intermittent hypoxia with obstructive sleep apnea 06/22/2020   Chronic low back pain 07/08/2015   Chronic respiratory failure with hypoxia (HCC) 10/09/2021   Claudication (HCC)    COPD (chronic obstructive pulmonary disease) (HCC)    COPD (chronic obstructive pulmonary disease) with chronic bronchitis 11/10/2018   Corn or callus 08/28/2017   Coronary arteriosclerosis 05/30/2021   Coronary artery disease    COVID-19 virus infection 11/21/2018   CPAP/BiPAP dependence 07/08/2015   Degenerative lumbar spinal stenosis 03/02/2020   Depression    Diabetes mellitus without complication ([place])    Diabetic polyneuropathy associated with type 2 diabetes mellitus ([place]) 11/10/2018   DOE (dyspnea on exertion) 03/12/2022   Fibromyalgia    Fibromyalgia    GERD (gastroesophageal reflux disease)    Heart disease    Hyperlipidemia associated with type 2 diabetes mellitus ([place])    Hypertension  IBS (irritable bowel syndrome)    Innominate artery stenosis ([place]) 07/08/2015   Formatting of this note might be different from the original. Open stent in 11/2012 and restent in 07/2013   Intolerance of continuous positive airway pressure (CPAP) ventilation 11/10/2018   Lumbar radiculopathy 12/29/2020   Lumbar spondylosis 01/25/2021   Macular degeneration    Neuropathic pain of both  feet 07/08/2015   Nodule of right lung 07/08/2015   Obesity 07/08/2015   Obesity (BMI 35.0-39.9 without comorbidity) 03/12/2022   Obstructive sleep apnea 02/19/2019   Osteopenia 12/08/2018   PAD (peripheral artery disease) ([place]) 11/10/2018   Peripheral vascular angioplasty status with implants and grafts 07/08/2015   Peripheral vascular disease ([place]) 03/02/2020   Primary fibromyalgia syndrome 03/02/2020   Recurrent insomnia 08/13/2019   Restless legs syndrome (RLS) 02/21/2016   Spondylolisthesis, grade 1 06/19/2020   Statin intolerance    Status post arterial stent 11/10/2018   Status post insertion of hypoglossal nerve stimulator 09/11/2021   Status post insertion of nerve stimulator 06/22/2020   Type 2 diabetes mellitus with diabetic mononeuropathy, without long-term current use of insulin ([place]) 12/05/2015   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, without long-term current use of insulin ([place]) 11/10/2018    Past Surgical History:  Procedure Laterality Date   CARDIAC CATHETERIZATION     DRUG INDUCED ENDOSCOPY N/A 02/19/2019   Procedure: DRUG INDUCED SLEEP ENDOSCOPY;  Surgeon: Jerrell Belfast, MD;  Location: [facility];  Service: ENT;  Laterality: N/A;   EYE SURGERY Bilateral    cataract   IMPLANTATION OF HYPOGLOSSAL NERVE STIMULATOR Right 06/30/2019   Procedure: IMPLANTATION OF HYPOGLOSSAL NERVE STIMULATOR;  Surgeon: Jerrell Belfast, MD;  Location: [facility];  Service: ENT;  Laterality: Right;  Neck, chest and lower lateral chest   SKIN BIOPSY     SMALL INTESTINE SURGERY     TUBAL LIGATION     two carotid stent      Current Medications: Current Meds  Medication Sig   albuterol (VENTOLIN HFA) 108 (90 Base) MCG/ACT inhaler Inhale 1-2 puffs into the lungs every 6 (six) hours as needed.   amLODipine (NORVASC) 10 MG tablet Take 10 mg by mouth daily.   busPIRone (BUSPAR) 5 MG tablet Take 5 mg by mouth every 8 (eight) hours as needed  for anxiety.   clopidogrel (PLAVIX) 75 MG tablet Take 1 tablet (75 mg total) by mouth daily. Restart plavix on 07/03/19   Coenzyme Q10 (COQ10) 100 MG CAPS Take 100 mg by mouth daily.   diclofenac Sodium (VOLTAREN) 1 % GEL Apply 1 Application topically as needed for pain.   estradiol (ESTRACE) 0.1 MG/GM vaginal cream Place 1 Applicatorful vaginally once a week.   ezetimibe (ZETIA) 10 MG tablet Take 10 mg by mouth daily.   FARXIGA 5 MG TABS tablet Take 5 mg by mouth every morning.   fexofenadine (ALLEGRA) 180 MG tablet Take 180 mg by mouth daily.   fluticasone (FLONASE) 50 MCG/ACT nasal spray Place 1 spray into both nostrils daily.   gabapentin (NEURONTIN) 100 MG capsule Take 200 mg by mouth at bedtime.   hydrochlorothiazide (HYDRODIURIL) 12.5 MG tablet Take 12.5 mg by mouth every morning.   meclizine (ANTIVERT) 25 MG tablet Take 25-50 mg by mouth 2 (two) times daily.   metoprolol succinate (TOPROL-XL) 100 MG 24 hr tablet Take 100 mg by mouth daily.   Multiple Vitamin (MULTI-VITAMIN PO) Take 1 tablet by mouth daily.    nitroGLYCERIN (NITROSTAT) 0.4  MG SL tablet Place 0.4 mg under the tongue every 5 (five) minutes as needed for chest pain.   OZEMPIC, 0.25 OR 0.5 MG/DOSE, 2 MG/3ML SOPN Inject 0.25 mg into the skin once a week.   pantoprazole (PROTONIX) 40 MG tablet Take 40 mg by mouth every morning.    RESTASIS 0.05 % ophthalmic emulsion Place 1 drop into both eyes 2 (two) times daily.   venlafaxine XR (EFFEXOR-XR) 75 MG 24 hr capsule Take 75 mg by mouth at bedtime.      Allergies:   Penicillins, Asa [aspirin], Toradol [ketorolac tromethamine], Morphine and related, and Tramadol   Social History   Socioeconomic History   Marital status: Unknown    Spouse name: Not on file   Number of children: Not on file   Years of education: Not on file   Highest education level: Not on file  Occupational History   Not on file  Tobacco Use   Smoking status: Former    Types: Cigarettes    Passive  exposure: Past   Smokeless tobacco: Never  Vaping Use   Vaping Use: Never used  Substance and Sexual Activity   Alcohol use: Not Currently   Drug use: Never   Sexual activity: Not on file  Other Topics Concern   Not on file  Social History Narrative   Not on file   Social Determinants of Health   Financial Resource Strain: Not on file  Food Insecurity: Not on file  Transportation Needs: Not on file  Physical Activity: Not on file  Stress: Not on file  Social Connections: Not on file     Family History: The patient's family history includes Arthritis in her mother; Diabetes in her maternal grandmother, mother, and sister; Heart attack in her father; Heart failure in her mother; Hypertension in her mother; Pancreatic cancer in her brother; Skin cancer in her sister.  ROS:   Please see the history of present illness.    All other systems reviewed and are negative.  EKGs/Labs/Other Studies Reviewed:    The following studies were reviewed today: EKG reveals sinus tachycardia, nonspecific ST changes   Recent Labs: 10/09/2021: BUN 16; Creatinine, Ser 0.99; Hemoglobin 13.2; Platelets 269.0; Potassium 4.1; Pro B Natriuretic peptide (BNP) 38.0; Sodium 137  Recent Lipid Panel No results found for: "CHOL", "TRIG", "HDL", "CHOLHDL", "VLDL", "LDLCALC", "LDLDIRECT"  Physical Exam:    VS:  BP (!) 136/58   Pulse 82   Ht '5\' 1"'$  (1.549 m)   Wt 188 lb 12.8 oz (85.6 kg)   SpO2 97%   BMI 35.67 kg/m     Wt Readings from Last 3 Encounters:  06/06/22 188 lb 12.8 oz (85.6 kg)  03/29/22 186 lb 12.8 oz (84.7 kg)  03/26/22 188 lb (85.3 kg)     GEN: Patient is in no acute distress HEENT: Normal NECK: No JVD; No carotid bruits LYMPHATICS: No lymphadenopathy CARDIAC: Hear sounds regular, 2/6 systolic murmur at the apex. RESPIRATORY:  Clear to auscultation without rales, wheezing or rhonchi  ABDOMEN: Soft, non-tender, non-distended MUSCULOSKELETAL:  No edema; No deformity  SKIN: Warm  and dry NEUROLOGIC:  Alert and oriented x 3 PSYCHIATRIC:  Normal affect   Signed, Jenean Lindau, MD  06/06/2022 2:28 PM    [place]

## 2022-06-10 ENCOUNTER — Ambulatory Visit: Payer: Medicare Other | Admitting: Adult Health

## 2022-06-19 DIAGNOSIS — J449 Chronic obstructive pulmonary disease, unspecified: Secondary | ICD-10-CM | POA: Diagnosis not present

## 2022-06-21 ENCOUNTER — Ambulatory Visit: Payer: Medicare Other | Admitting: Adult Health

## 2022-06-25 DIAGNOSIS — N390 Urinary tract infection, site not specified: Secondary | ICD-10-CM | POA: Diagnosis not present

## 2022-06-25 DIAGNOSIS — N3281 Overactive bladder: Secondary | ICD-10-CM | POA: Diagnosis not present

## 2022-06-28 ENCOUNTER — Ambulatory Visit: Payer: Medicare Other | Admitting: Adult Health

## 2022-07-19 DIAGNOSIS — J449 Chronic obstructive pulmonary disease, unspecified: Secondary | ICD-10-CM | POA: Diagnosis not present

## 2022-07-25 DIAGNOSIS — H1013 Acute atopic conjunctivitis, bilateral: Secondary | ICD-10-CM | POA: Diagnosis not present

## 2022-07-29 ENCOUNTER — Ambulatory Visit: Payer: Medicare Other | Admitting: Adult Health

## 2022-08-07 DIAGNOSIS — E538 Deficiency of other specified B group vitamins: Secondary | ICD-10-CM | POA: Diagnosis not present

## 2022-08-13 ENCOUNTER — Ambulatory Visit
Admission: RE | Admit: 2022-08-13 | Discharge: 2022-08-13 | Disposition: A | Discharge status: Home / Self Care | Payer: Medicare HMO | Source: Ambulatory Visit | Attending: Adult Health | Admitting: Adult Health

## 2022-08-13 DIAGNOSIS — J84112 Idiopathic pulmonary fibrosis: Secondary | ICD-10-CM | POA: Diagnosis not present

## 2022-08-13 DIAGNOSIS — J9611 Chronic respiratory failure with hypoxia: Secondary | ICD-10-CM

## 2022-08-13 DIAGNOSIS — R9389 Abnormal findings on diagnostic imaging of other specified body structures: Secondary | ICD-10-CM

## 2022-08-13 DIAGNOSIS — I700 Atherosclerosis of aorta: Secondary | ICD-10-CM | POA: Diagnosis not present

## 2022-08-13 DIAGNOSIS — J479 Bronchiectasis, uncomplicated: Secondary | ICD-10-CM | POA: Diagnosis not present

## 2022-08-13 DIAGNOSIS — K449 Diaphragmatic hernia without obstruction or gangrene: Secondary | ICD-10-CM | POA: Diagnosis not present

## 2022-08-14 ENCOUNTER — Encounter: Payer: Self-pay | Admitting: Adult Health

## 2022-08-14 ENCOUNTER — Ambulatory Visit (INDEPENDENT_AMBULATORY_CARE_PROVIDER_SITE_OTHER): Payer: Medicare HMO | Admitting: Adult Health

## 2022-08-14 VITALS — BP 126/70 | HR 87 | Temp 98.3°F | Ht 61.0 in | Wt 189.8 lb

## 2022-08-14 DIAGNOSIS — J8410 Pulmonary fibrosis, unspecified: Secondary | ICD-10-CM | POA: Diagnosis not present

## 2022-08-14 DIAGNOSIS — G4733 Obstructive sleep apnea (adult) (pediatric): Secondary | ICD-10-CM | POA: Diagnosis not present

## 2022-08-14 DIAGNOSIS — J9611 Chronic respiratory failure with hypoxia: Secondary | ICD-10-CM | POA: Diagnosis not present

## 2022-08-14 NOTE — Patient Instructions (Addendum)
Continue on inspire at bedtime, keep follow up with Neurology.  Wear Oxygen 2 L with activity and at bedtime, keep O2 sats >88-90%.  AYR saline nasal gel Twice daily  .  Albuterol inhaler 1-2 puffs every 4-6hr as needed.  Activity as tolerated I will call with CT results.  Follow up with Dr. Elsworth Soho or Soundra Lampley NP in 4 months and As needed   Please contact office for sooner follow up if symptoms do not improve or worsen or seek emergency care

## 2022-08-14 NOTE — Progress Notes (Signed)
$'@Patient'E$  ID: Lori Byrd, female    DOB: Dec 08, 1950, 72 y.o.   MRN: [id number]  Chief Complaint  Patient presents with   Follow-up    Referring provider: Carvel Getting Key, *  HPI: 72 year old female former smoker seen for pulmonary consult October 09, 2021 for nocturnal hypoxemia Medical history significant for sleep apnea, CPAP intolerant status post inspire device 2021 (managed by neurology)   TEST/EVENTS :  Home sleep study January 18, 2019 showed severe sleep apnea with AHI at 53/hour with frequent episodes of hypoxemia sleep apnea is worst in the prone position with AHI at 65.8/hour   2 D echo 10/24/21 -2D echo shows some mild grade 1 diastolic dysfunction-this is some mild stiffness of the heart.  Pulm function is normal.  Normal pulmonary artery systolic pressure.  Mild thickening of the mitral valve no stenosis.  No regurg. RV size is mildly enlarged and mildly reduced systolic function. (D Dimer was neg)    2D echo with bubble study Dec 06, 2021 negative.   Stress Myoview March 26, 2022 normal, low risk study, EF 72%.   PFT Nov 26, 2021 showed FEV1 at 96%, ratio 84, FVC 87%, no significant bronchodilator response, DLCO 67%.   ONO showed significant desat > 4h   08/14/2022 Follow up ; OSA , chronic respiratory failure Patient returns for a 67-monthfollow-up.  Patient has known history of sleep apnea.  Was CPAP intolerant.  Underwent inspire device implantation in 2021.  Follow-up inspire titration study showed ongoing hypoxemia.  Pulmonary evaluation in March 2023 showed exertional hypoxemia as well.  Patient was started on oxygen 2 L with activity and at bedtime.  PFTs showed normal lung function with decreased diffusing capacity.  2D echo showed grade 1 diastolic dysfunction.  Normal pulmonary artery systolic pressures.  D-dimer was negative.  2D echo with bubble study was negative.  Stress Myoview showed normal low risk study with EF at 72%. No history of  amiodarone , macrodantin use. No history of autoimmune. Has 1 dog. No birds/chicken. No hot tub. Has basement-does not use-closed off.  Occasional cough -minimally productive . Gets winded with heavy activity , carrying heavy packages or prolonged walking.  Does all housework independently, shopping ,driving. No dyspnea with housework.  Uses Inspire every night for around 9 hr. Wear oxygen at bedtime. Causing nasal irritation.   Allergies  Allergen Reactions   Penicillins Anaphylaxis    Did it involve swelling of the face/tongue/throat, SOB, or low BP? Yes Did it involve sudden or severe rash/hives, skin peeling, or any reaction on the inside of your mouth or nose? Yes Did you need to seek medical attention at a hospital or doctor's office? Yes When did it last happen? childhood      If all above answers are "NO", may proceed with cephalosporin use.    Asa [Aspirin] Other (See Comments)    Per Dr not to take; on Plavix   Toradol [Ketorolac Tromethamine] Other (See Comments)    Told by MD not to take; on Plavix    Morphine And Related Other (See Comments)    Pt preference    Tramadol Other (See Comments)    Pt does not like how this makes her feel    Immunization History  Administered Date(s) Administered   Fluad Quad(high Dose 65+) 05/21/2021   Influenza, High Dose Seasonal PF 05/05/2019, 05/31/2019   Pneumococcal Conjugate-13 04/19/2015, 05/26/2017   Tdap 04/19/2015   Zoster Recombinat (Shingrix) 04/19/2018, 08/22/2018  Zoster, Live 11/18/2012    Past Medical History:  Diagnosis Date   Acquired thrombophilia ([place]) 05/30/2021   Anemia 07/08/2015   Anxiety    Arteriosclerosis of both carotid arteries 07/08/2015   Arthritis    B12 deficiency    Bilateral bunions 07/08/2015   Bilateral carotid artery stenosis 08/11/2018   BMI 36.0-36.9,adult    Cancer (HCC)    skin cancer   Cardiac murmur 03/12/2022   Carotid artery stenosis    Chronic intermittent hypoxia with  obstructive sleep apnea 06/22/2020   Chronic low back pain 07/08/2015   Chronic respiratory failure with hypoxia (HCC) 10/09/2021   Claudication (HCC)    COPD (chronic obstructive pulmonary disease) (HCC)    COPD (chronic obstructive pulmonary disease) with chronic bronchitis 11/10/2018   Corn or callus 08/28/2017   Coronary arteriosclerosis 05/30/2021   Coronary artery disease    COVID-19 virus infection 11/21/2018   CPAP/BiPAP dependence 07/08/2015   Degenerative lumbar spinal stenosis 03/02/2020   Depression    Diabetes mellitus without complication ([place])    Diabetic polyneuropathy associated with type 2 diabetes mellitus ([place]) 11/10/2018   DOE (dyspnea on exertion) 03/12/2022   Fibromyalgia    Fibromyalgia    GERD (gastroesophageal reflux disease)    Heart disease    Hyperlipidemia associated with type 2 diabetes mellitus (HCC)    Hypertension    IBS (irritable bowel syndrome)    Innominate artery stenosis ([place]) 07/08/2015   Formatting of this note might be different from the original. Open stent in 11/2012 and restent in 07/2013   Intolerance of continuous positive airway pressure (CPAP) ventilation 11/10/2018   Lumbar radiculopathy 12/29/2020   Lumbar spondylosis 01/25/2021   Macular degeneration    Neuropathic pain of both feet 07/08/2015   Nodule of right lung 07/08/2015   Obesity 07/08/2015   Obesity (BMI 35.0-39.9 without comorbidity) 03/12/2022   Obstructive sleep apnea 02/19/2019   Osteopenia 12/08/2018   PAD (peripheral artery disease) ([place]) 11/10/2018   Peripheral vascular angioplasty status with implants and grafts 07/08/2015   Peripheral vascular disease ([place]) 03/02/2020   Primary fibromyalgia syndrome 03/02/2020   Recurrent insomnia 08/13/2019   Restless legs syndrome (RLS) 02/21/2016   Spondylolisthesis, grade 1 06/19/2020   Statin intolerance    Status post arterial stent 11/10/2018   Status post insertion of hypoglossal nerve stimulator 09/11/2021    Status post insertion of nerve stimulator 06/22/2020   Type 2 diabetes mellitus with diabetic mononeuropathy, without long-term current use of insulin ([place]) 12/05/2015   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 8 g 2   amLODipine (NORVASC) 10 MG tablet Take 10 mg by mouth daily.     busPIRone (BUSPAR) 5 MG tablet Take 5 mg by mouth every 8 (eight) hours as needed for anxiety.     clopidogrel (PLAVIX) 75 MG tablet Take 1 tablet (75 mg total) by mouth daily. Restart plavix on 07/03/19 30 tablet 12   Coenzyme Q10 (COQ10) 100 MG CAPS Take 100 mg by mouth daily.     diclofenac  Sodium (VOLTAREN) 1 % GEL Apply 1 Application topically as needed for pain.     estradiol (ESTRACE) 0.1 MG/GM vaginal cream Place 1 Applicatorful vaginally once a week.     ezetimibe (ZETIA) 10 MG tablet Take 10 mg by mouth daily.     FARXIGA 5 MG TABS tablet Take 5 mg by mouth every morning.     fexofenadine (ALLEGRA) 180 MG tablet Take 180 mg by mouth daily.     fluticasone (FLONASE) 50 MCG/ACT nasal spray Place 1 spray into both nostrils daily.     gabapentin (NEURONTIN) 100 MG capsule Take 200 mg by mouth at bedtime.     hydrochlorothiazide (HYDRODIURIL) 12.5 MG tablet Take 12.5 mg by mouth every morning.     meclizine (ANTIVERT) 25 MG tablet Take 25-50 mg by mouth 2 (two) times daily.     metoprolol succinate (TOPROL-XL) 100 MG 24 hr tablet Take 100 mg by mouth daily.     Multiple Vitamin (MULTI-VITAMIN PO) Take 1 tablet by mouth daily.      nitroGLYCERIN  (NITROSTAT) 0.4 MG SL tablet Place 0.4 mg under the tongue every 5 (five) minutes as needed for chest pain.     OZEMPIC, 0.25 OR 0.5 MG/DOSE, 2 MG/3ML SOPN Inject 0.25 mg into the skin once a week.     pantoprazole (PROTONIX) 40 MG tablet Take 40 mg by mouth every morning.      RESTASIS 0.05 % ophthalmic emulsion Place 1 drop into both eyes 2 (two) times daily.     venlafaxine XR (EFFEXOR-XR) 75 MG 24 hr capsule Take 75 mg by mouth at bedtime.      No facility-administered medications prior to visit.     Review of Systems:   Constitutional:   No  weight loss, night sweats,  Fevers, chills, fatigue, or  lassitude.  HEENT:   No headaches,  Difficulty swallowing,  Tooth/dental problems, or  Sore throat,                No sneezing, itching, ear ache, nasal congestion, post nasal drip,   CV:  No chest pain,  Orthopnea, PND, swelling in lower extremities, anasarca, dizziness, palpitations, syncope.   GI  No heartburn, indigestion, abdominal pain, nausea, vomiting, diarrhea, change in bowel habits, loss of appetite, bloody stools.   Resp: No shortness of breath with exertion or at rest.  No excess mucus, no productive cough,  No non-productive cough,  No coughing up of blood.  No change in color of mucus.  No wheezing.  No chest wall deformity  Skin: no rash or lesions.  GU: no dysuria, change in color of urine, no urgency or frequency.  No flank pain, no hematuria   MS:  No joint pain or swelling.  No decreased range of motion.  No back pain.    Physical Exam  BP 126/70   Pulse 87   Temp 98.3 F (36.8 C) (Oral)   Ht '5\' 1"'$  (1.549 m)   Wt 189 lb 12.8 oz (86.1 kg)   SpO2 92%   BMI 35.86 kg/m   GEN: A/Ox3; pleasant , NAD, well nourished    HEENT:  [place]/AT,  EACs-clear, TMs-wnl, NOSE-clear, THROAT-clear, no lesions, no postnasal drip or exudate noted.   NECK:  Supple w/ fair ROM; no JVD; normal carotid impulses w/o bruits; no thyromegaly or nodules palpated; no lymphadenopathy.     RESP  Clear  P & A; w/o, wheezes/ rales/ or rhonchi. no accessory muscle use, no dullness to  percussion  CARD:  RRR, no m/r/g, no peripheral edema, pulses intact, no cyanosis or clubbing.  GI:   Soft & nt; nml bowel sounds; no organomegaly or masses detected.   Musco: Warm bil, no deformities or joint swelling noted.   Neuro: alert, no focal deficits noted.    Skin: Warm, no lesions or rashes    Lab Results:      Imaging: No results found.       Latest Ref Rng & Units 11/26/2021    1:10 PM  PFT Results  FVC-Pre L 2.31   FVC-Predicted Pre % 87   FVC-Post L 2.30   FVC-Predicted Post % 87   Pre FEV1/FVC % % 82   Post FEV1/FCV % % 84   FEV1-Pre L 1.90   FEV1-Predicted Pre % 95   FEV1-Post L 1.92   DLCO uncorrected ml/min/mmHg 11.80   DLCO UNC% % 67   DLCO corrected ml/min/mmHg 11.87   DLCO COR %Predicted % 67   DLVA Predicted % 83   TLC L 4.02   TLC % Predicted % 87   RV % Predicted % 81     No results found for: "NITRICOXIDE"      Assessment & Plan:   No problem-specific Assessment & Plan notes found for this encounter.     Rexene [place], NP 08/14/2022

## 2022-08-15 ENCOUNTER — Telehealth: Payer: Self-pay | Admitting: Adult Health

## 2022-08-15 DIAGNOSIS — J8410 Pulmonary fibrosis, unspecified: Secondary | ICD-10-CM | POA: Insufficient documentation

## 2022-08-15 HISTORY — DX: Pulmonary fibrosis, unspecified: J84.10

## 2022-08-15 NOTE — Assessment & Plan Note (Signed)
Fibrotic changes on CT chest.  Chronic changes noted since 2005.  Questionable etiology.  ? Post inflammatory Fibrosis , ?fibrotic sarcoidosis, fibrotic HP is an additional consideration. Findings are suggestive of an alternative diagnosis (not UIP)  Will check RA factor, ANA, CCP, ACE, HSP    For now continue on oxygen to maintain O2 saturations greater than 88 to 90%.  Continue to follow spirometry with DLCO

## 2022-08-15 NOTE — Telephone Encounter (Signed)
Already taken care of by TP. Nothing further needed

## 2022-08-15 NOTE — Telephone Encounter (Signed)
Pt calling. Has NP got CT results yet? Please call to advise @ [phone]

## 2022-08-15 NOTE — Assessment & Plan Note (Signed)
Reported excellent compliance and perceived benefit on the inspire device.  Continue follow-up with neurology

## 2022-08-15 NOTE — Assessment & Plan Note (Addendum)
Chronic respiratory failure with exertional and nocturnal hypoxemia.  Workup showed no airflow obstruction or restriction on PFTs, decreased diffusing capacity.  Echo was negative for pulmonary hypertension.  2D echo with bubble study was negative for shunt/PFO.  D-dimer was negative.  Stress Myoview was negative. High-resolution CT chest shows upper lobe predominant subpleural and peribronchovascular reticular opacities with associated subtle peribronchovascular nodularity, traction bronchiectasis and honeycomb change. Findings have beenpresent when compared with prior exams dating back to 2005 have slowly progressed. Solid pulmonary nodule of the right lower lobe measuring 5 mm, unchanged when compared with prior exams dating back to 2015 and considered benign.  Favored to be due to fibrotic sarcoidosis, fibrotic HP is an additional consideration. Findings are suggestive of an alternative diagnosis (not UIP)  She does report a severe pneumonia in the past .  Radiology was able to compare to previous films in the system.  Looks like patient has had some chronic upper lobe scarring and bronchiectasis.-Will have her return for labs including ACE level, connective tissue/autoimmune labs.

## 2022-08-31 DIAGNOSIS — I152 Hypertension secondary to endocrine disorders: Secondary | ICD-10-CM | POA: Diagnosis not present

## 2022-08-31 DIAGNOSIS — Z6835 Body mass index (BMI) 35.0-35.9, adult: Secondary | ICD-10-CM | POA: Diagnosis not present

## 2022-08-31 DIAGNOSIS — E1159 Type 2 diabetes mellitus with other circulatory complications: Secondary | ICD-10-CM | POA: Diagnosis not present

## 2022-08-31 DIAGNOSIS — H3530 Unspecified macular degeneration: Secondary | ICD-10-CM | POA: Diagnosis not present

## 2022-08-31 DIAGNOSIS — R5383 Other fatigue: Secondary | ICD-10-CM | POA: Diagnosis not present

## 2022-08-31 DIAGNOSIS — E119 Type 2 diabetes mellitus without complications: Secondary | ICD-10-CM | POA: Diagnosis not present

## 2022-08-31 DIAGNOSIS — E538 Deficiency of other specified B group vitamins: Secondary | ICD-10-CM | POA: Diagnosis not present

## 2022-08-31 DIAGNOSIS — E559 Vitamin D deficiency, unspecified: Secondary | ICD-10-CM | POA: Diagnosis not present

## 2022-08-31 DIAGNOSIS — E785 Hyperlipidemia, unspecified: Secondary | ICD-10-CM | POA: Diagnosis not present

## 2022-08-31 DIAGNOSIS — E1169 Type 2 diabetes mellitus with other specified complication: Secondary | ICD-10-CM | POA: Diagnosis not present

## 2022-09-06 DIAGNOSIS — E538 Deficiency of other specified B group vitamins: Secondary | ICD-10-CM | POA: Diagnosis not present

## 2022-09-09 DIAGNOSIS — I771 Stricture of artery: Secondary | ICD-10-CM | POA: Diagnosis not present

## 2022-09-09 DIAGNOSIS — I6523 Occlusion and stenosis of bilateral carotid arteries: Secondary | ICD-10-CM | POA: Diagnosis not present

## 2022-09-20 DIAGNOSIS — R0981 Nasal congestion: Secondary | ICD-10-CM | POA: Diagnosis not present

## 2022-09-20 DIAGNOSIS — R059 Cough, unspecified: Secondary | ICD-10-CM | POA: Diagnosis not present

## 2022-09-20 DIAGNOSIS — R509 Fever, unspecified: Secondary | ICD-10-CM | POA: Diagnosis not present

## 2022-09-20 DIAGNOSIS — R6883 Chills (without fever): Secondary | ICD-10-CM | POA: Diagnosis not present

## 2022-10-04 DIAGNOSIS — E538 Deficiency of other specified B group vitamins: Secondary | ICD-10-CM | POA: Diagnosis not present

## 2022-10-06 ENCOUNTER — Other Ambulatory Visit: Payer: Self-pay | Admitting: Adult Health

## 2022-10-07 DIAGNOSIS — I6523 Occlusion and stenosis of bilateral carotid arteries: Secondary | ICD-10-CM | POA: Diagnosis not present

## 2022-10-07 DIAGNOSIS — I771 Stricture of artery: Secondary | ICD-10-CM | POA: Diagnosis not present

## 2022-10-07 DIAGNOSIS — Z95820 Peripheral vascular angioplasty status with implants and grafts: Secondary | ICD-10-CM | POA: Diagnosis not present

## 2022-10-07 DIAGNOSIS — Z6835 Body mass index (BMI) 35.0-35.9, adult: Secondary | ICD-10-CM | POA: Diagnosis not present

## 2022-10-07 DIAGNOSIS — I70213 Atherosclerosis of native arteries of extremities with intermittent claudication, bilateral legs: Secondary | ICD-10-CM | POA: Diagnosis not present

## 2022-10-18 DIAGNOSIS — R42 Dizziness and giddiness: Secondary | ICD-10-CM | POA: Diagnosis not present

## 2022-10-18 DIAGNOSIS — I10 Essential (primary) hypertension: Secondary | ICD-10-CM | POA: Diagnosis not present

## 2022-10-18 DIAGNOSIS — L251 Unspecified contact dermatitis due to drugs in contact with skin: Secondary | ICD-10-CM | POA: Diagnosis not present

## 2022-10-18 DIAGNOSIS — J479 Bronchiectasis, uncomplicated: Secondary | ICD-10-CM | POA: Diagnosis not present

## 2022-10-18 DIAGNOSIS — R000 Tachycardia, unspecified: Secondary | ICD-10-CM | POA: Diagnosis not present

## 2022-10-18 DIAGNOSIS — E119 Type 2 diabetes mellitus without complications: Secondary | ICD-10-CM | POA: Diagnosis not present

## 2022-10-18 DIAGNOSIS — R0902 Hypoxemia: Secondary | ICD-10-CM | POA: Diagnosis not present

## 2022-10-18 DIAGNOSIS — Z20822 Contact with and (suspected) exposure to covid-19: Secondary | ICD-10-CM | POA: Diagnosis not present

## 2022-10-18 DIAGNOSIS — I6523 Occlusion and stenosis of bilateral carotid arteries: Secondary | ICD-10-CM | POA: Diagnosis not present

## 2022-10-21 DIAGNOSIS — N390 Urinary tract infection, site not specified: Secondary | ICD-10-CM | POA: Diagnosis not present

## 2022-10-23 ENCOUNTER — Telehealth: Payer: Self-pay

## 2022-10-23 NOTE — Telephone Encounter (Signed)
     Patient  visit on Lori Byrd   3/29 Have you been able to follow up with your primary care physician? Yes   The patient was or was not able to obtain any needed medicine or equipment. Yes    Are there diet recommendations that you are having difficulty following? Na    Patient expresses understanding of discharge instructions and education provided has no other needs at this time. Yes      [facility] [phone] [address], [place],  [address] Phone: [phone] Email: [email or web address]

## 2022-11-05 DIAGNOSIS — I2510 Atherosclerotic heart disease of native coronary artery without angina pectoris: Secondary | ICD-10-CM | POA: Diagnosis not present

## 2022-11-05 DIAGNOSIS — R55 Syncope and collapse: Secondary | ICD-10-CM | POA: Diagnosis not present

## 2022-11-05 DIAGNOSIS — E538 Deficiency of other specified B group vitamins: Secondary | ICD-10-CM | POA: Diagnosis not present

## 2022-11-05 DIAGNOSIS — Z6836 Body mass index (BMI) 36.0-36.9, adult: Secondary | ICD-10-CM | POA: Diagnosis not present

## 2022-11-12 DIAGNOSIS — H52223 Regular astigmatism, bilateral: Secondary | ICD-10-CM | POA: Diagnosis not present

## 2022-11-12 DIAGNOSIS — E119 Type 2 diabetes mellitus without complications: Secondary | ICD-10-CM | POA: Diagnosis not present

## 2022-11-12 DIAGNOSIS — H5213 Myopia, bilateral: Secondary | ICD-10-CM | POA: Diagnosis not present

## 2022-11-14 DIAGNOSIS — Z0100 Encounter for examination of eyes and vision without abnormal findings: Secondary | ICD-10-CM | POA: Diagnosis not present

## 2022-11-23 DIAGNOSIS — R5383 Other fatigue: Secondary | ICD-10-CM | POA: Diagnosis not present

## 2022-11-23 DIAGNOSIS — I152 Hypertension secondary to endocrine disorders: Secondary | ICD-10-CM | POA: Diagnosis not present

## 2022-11-23 DIAGNOSIS — E1169 Type 2 diabetes mellitus with other specified complication: Secondary | ICD-10-CM | POA: Diagnosis not present

## 2022-11-23 DIAGNOSIS — E1159 Type 2 diabetes mellitus with other circulatory complications: Secondary | ICD-10-CM | POA: Diagnosis not present

## 2022-11-23 DIAGNOSIS — H3530 Unspecified macular degeneration: Secondary | ICD-10-CM | POA: Diagnosis not present

## 2022-11-23 DIAGNOSIS — E119 Type 2 diabetes mellitus without complications: Secondary | ICD-10-CM | POA: Diagnosis not present

## 2022-11-23 DIAGNOSIS — E785 Hyperlipidemia, unspecified: Secondary | ICD-10-CM | POA: Diagnosis not present

## 2022-11-23 DIAGNOSIS — Z6836 Body mass index (BMI) 36.0-36.9, adult: Secondary | ICD-10-CM | POA: Diagnosis not present

## 2022-11-28 DIAGNOSIS — M21611 Bunion of right foot: Secondary | ICD-10-CM | POA: Diagnosis not present

## 2022-11-28 DIAGNOSIS — M21612 Bunion of left foot: Secondary | ICD-10-CM | POA: Diagnosis not present

## 2022-11-28 DIAGNOSIS — E119 Type 2 diabetes mellitus without complications: Secondary | ICD-10-CM | POA: Diagnosis not present

## 2022-11-28 DIAGNOSIS — E1142 Type 2 diabetes mellitus with diabetic polyneuropathy: Secondary | ICD-10-CM | POA: Diagnosis not present

## 2022-12-02 DIAGNOSIS — J449 Chronic obstructive pulmonary disease, unspecified: Secondary | ICD-10-CM | POA: Diagnosis not present

## 2022-12-02 DIAGNOSIS — M19011 Primary osteoarthritis, right shoulder: Secondary | ICD-10-CM | POA: Diagnosis not present

## 2022-12-02 DIAGNOSIS — M25512 Pain in left shoulder: Secondary | ICD-10-CM | POA: Diagnosis not present

## 2022-12-02 DIAGNOSIS — I10 Essential (primary) hypertension: Secondary | ICD-10-CM | POA: Diagnosis not present

## 2022-12-02 DIAGNOSIS — M25511 Pain in right shoulder: Secondary | ICD-10-CM | POA: Diagnosis not present

## 2022-12-02 DIAGNOSIS — R0789 Other chest pain: Secondary | ICD-10-CM | POA: Diagnosis not present

## 2022-12-02 DIAGNOSIS — R079 Chest pain, unspecified: Secondary | ICD-10-CM | POA: Diagnosis not present

## 2022-12-02 DIAGNOSIS — R0781 Pleurodynia: Secondary | ICD-10-CM | POA: Diagnosis not present

## 2022-12-02 DIAGNOSIS — M19012 Primary osteoarthritis, left shoulder: Secondary | ICD-10-CM | POA: Diagnosis not present

## 2022-12-13 ENCOUNTER — Ambulatory Visit: Payer: Medicare HMO | Admitting: Adult Health

## 2022-12-18 ENCOUNTER — Ambulatory Visit: Payer: Medicare HMO | Admitting: Adult Health

## 2022-12-18 DIAGNOSIS — Z9181 History of falling: Secondary | ICD-10-CM | POA: Diagnosis not present

## 2022-12-18 DIAGNOSIS — M62838 Other muscle spasm: Secondary | ICD-10-CM | POA: Diagnosis not present

## 2022-12-18 DIAGNOSIS — Z6837 Body mass index (BMI) 37.0-37.9, adult: Secondary | ICD-10-CM | POA: Diagnosis not present

## 2022-12-30 DIAGNOSIS — N952 Postmenopausal atrophic vaginitis: Secondary | ICD-10-CM | POA: Diagnosis not present

## 2022-12-30 DIAGNOSIS — N390 Urinary tract infection, site not specified: Secondary | ICD-10-CM | POA: Diagnosis not present

## 2022-12-30 DIAGNOSIS — N3281 Overactive bladder: Secondary | ICD-10-CM | POA: Diagnosis not present

## 2023-01-05 ENCOUNTER — Other Ambulatory Visit: Payer: Self-pay | Admitting: Adult Health

## 2023-01-13 DIAGNOSIS — M25561 Pain in right knee: Secondary | ICD-10-CM | POA: Diagnosis not present

## 2023-01-13 DIAGNOSIS — S8001XA Contusion of right knee, initial encounter: Secondary | ICD-10-CM | POA: Diagnosis not present

## 2023-01-13 DIAGNOSIS — Z6836 Body mass index (BMI) 36.0-36.9, adult: Secondary | ICD-10-CM | POA: Diagnosis not present

## 2023-01-13 DIAGNOSIS — M549 Dorsalgia, unspecified: Secondary | ICD-10-CM | POA: Diagnosis not present

## 2023-01-16 ENCOUNTER — Ambulatory Visit: Payer: Medicare HMO | Admitting: Adult Health

## 2023-02-11 DIAGNOSIS — J0100 Acute maxillary sinusitis, unspecified: Secondary | ICD-10-CM | POA: Diagnosis not present

## 2023-02-11 DIAGNOSIS — R0789 Other chest pain: Secondary | ICD-10-CM | POA: Diagnosis not present

## 2023-02-12 DIAGNOSIS — E1165 Type 2 diabetes mellitus with hyperglycemia: Secondary | ICD-10-CM | POA: Diagnosis not present

## 2023-02-12 DIAGNOSIS — Z6836 Body mass index (BMI) 36.0-36.9, adult: Secondary | ICD-10-CM | POA: Diagnosis not present

## 2023-02-26 ENCOUNTER — Ambulatory Visit: Payer: Medicare HMO | Admitting: Adult Health

## 2023-03-01 DIAGNOSIS — M797 Fibromyalgia: Secondary | ICD-10-CM | POA: Diagnosis not present

## 2023-03-01 DIAGNOSIS — E1169 Type 2 diabetes mellitus with other specified complication: Secondary | ICD-10-CM | POA: Diagnosis not present

## 2023-03-01 DIAGNOSIS — I152 Hypertension secondary to endocrine disorders: Secondary | ICD-10-CM | POA: Diagnosis not present

## 2023-03-01 DIAGNOSIS — H3530 Unspecified macular degeneration: Secondary | ICD-10-CM | POA: Diagnosis not present

## 2023-03-01 DIAGNOSIS — R42 Dizziness and giddiness: Secondary | ICD-10-CM | POA: Diagnosis not present

## 2023-03-01 DIAGNOSIS — F419 Anxiety disorder, unspecified: Secondary | ICD-10-CM | POA: Diagnosis not present

## 2023-03-01 DIAGNOSIS — E1159 Type 2 diabetes mellitus with other circulatory complications: Secondary | ICD-10-CM | POA: Diagnosis not present

## 2023-03-01 DIAGNOSIS — E785 Hyperlipidemia, unspecified: Secondary | ICD-10-CM | POA: Diagnosis not present

## 2023-03-01 DIAGNOSIS — E119 Type 2 diabetes mellitus without complications: Secondary | ICD-10-CM | POA: Diagnosis not present

## 2023-03-25 DIAGNOSIS — J069 Acute upper respiratory infection, unspecified: Secondary | ICD-10-CM | POA: Diagnosis not present

## 2023-03-25 DIAGNOSIS — R0602 Shortness of breath: Secondary | ICD-10-CM | POA: Diagnosis not present

## 2023-03-25 DIAGNOSIS — R509 Fever, unspecified: Secondary | ICD-10-CM | POA: Diagnosis not present

## 2023-03-25 DIAGNOSIS — R051 Acute cough: Secondary | ICD-10-CM | POA: Diagnosis not present

## 2023-03-25 DIAGNOSIS — R0981 Nasal congestion: Secondary | ICD-10-CM | POA: Diagnosis not present

## 2023-03-26 ENCOUNTER — Other Ambulatory Visit: Payer: Self-pay | Admitting: Pulmonary Disease

## 2023-03-27 DIAGNOSIS — R519 Headache, unspecified: Secondary | ICD-10-CM | POA: Diagnosis not present

## 2023-03-27 DIAGNOSIS — E1142 Type 2 diabetes mellitus with diabetic polyneuropathy: Secondary | ICD-10-CM | POA: Diagnosis not present

## 2023-03-27 DIAGNOSIS — R0981 Nasal congestion: Secondary | ICD-10-CM | POA: Diagnosis not present

## 2023-03-31 ENCOUNTER — Ambulatory Visit: Payer: Medicare HMO | Admitting: Adult Health

## 2023-04-07 DIAGNOSIS — R0602 Shortness of breath: Secondary | ICD-10-CM | POA: Diagnosis not present

## 2023-04-07 DIAGNOSIS — R519 Headache, unspecified: Secondary | ICD-10-CM | POA: Diagnosis not present

## 2023-04-14 DIAGNOSIS — I10 Essential (primary) hypertension: Secondary | ICD-10-CM | POA: Diagnosis not present

## 2023-04-14 DIAGNOSIS — I70213 Atherosclerosis of native arteries of extremities with intermittent claudication, bilateral legs: Secondary | ICD-10-CM | POA: Diagnosis not present

## 2023-04-14 DIAGNOSIS — E119 Type 2 diabetes mellitus without complications: Secondary | ICD-10-CM | POA: Diagnosis not present

## 2023-04-14 DIAGNOSIS — Z7902 Long term (current) use of antithrombotics/antiplatelets: Secondary | ICD-10-CM | POA: Diagnosis not present

## 2023-04-14 DIAGNOSIS — Z87891 Personal history of nicotine dependence: Secondary | ICD-10-CM | POA: Diagnosis not present

## 2023-04-14 DIAGNOSIS — I739 Peripheral vascular disease, unspecified: Secondary | ICD-10-CM | POA: Diagnosis not present

## 2023-04-14 DIAGNOSIS — I6523 Occlusion and stenosis of bilateral carotid arteries: Secondary | ICD-10-CM | POA: Diagnosis not present

## 2023-04-14 DIAGNOSIS — E785 Hyperlipidemia, unspecified: Secondary | ICD-10-CM | POA: Diagnosis not present

## 2023-05-02 DIAGNOSIS — I10 Essential (primary) hypertension: Secondary | ICD-10-CM | POA: Diagnosis not present

## 2023-05-02 DIAGNOSIS — F3341 Major depressive disorder, recurrent, in partial remission: Secondary | ICD-10-CM | POA: Diagnosis not present

## 2023-05-02 DIAGNOSIS — J309 Allergic rhinitis, unspecified: Secondary | ICD-10-CM | POA: Diagnosis not present

## 2023-05-02 DIAGNOSIS — K219 Gastro-esophageal reflux disease without esophagitis: Secondary | ICD-10-CM | POA: Diagnosis not present

## 2023-05-02 DIAGNOSIS — R5383 Other fatigue: Secondary | ICD-10-CM | POA: Diagnosis not present

## 2023-05-02 DIAGNOSIS — I739 Peripheral vascular disease, unspecified: Secondary | ICD-10-CM | POA: Diagnosis not present

## 2023-05-02 DIAGNOSIS — E7800 Pure hypercholesterolemia, unspecified: Secondary | ICD-10-CM | POA: Diagnosis not present

## 2023-05-02 DIAGNOSIS — E1169 Type 2 diabetes mellitus with other specified complication: Secondary | ICD-10-CM | POA: Diagnosis not present

## 2023-05-02 DIAGNOSIS — E669 Obesity, unspecified: Secondary | ICD-10-CM | POA: Diagnosis not present

## 2023-05-13 ENCOUNTER — Ambulatory Visit: Payer: Medicare HMO | Admitting: Adult Health

## 2023-05-16 DIAGNOSIS — I739 Peripheral vascular disease, unspecified: Secondary | ICD-10-CM | POA: Diagnosis not present

## 2023-05-16 DIAGNOSIS — E669 Obesity, unspecified: Secondary | ICD-10-CM | POA: Diagnosis not present

## 2023-05-16 DIAGNOSIS — K219 Gastro-esophageal reflux disease without esophagitis: Secondary | ICD-10-CM | POA: Diagnosis not present

## 2023-05-16 DIAGNOSIS — F3341 Major depressive disorder, recurrent, in partial remission: Secondary | ICD-10-CM | POA: Diagnosis not present

## 2023-05-16 DIAGNOSIS — Z23 Encounter for immunization: Secondary | ICD-10-CM | POA: Diagnosis not present

## 2023-05-16 DIAGNOSIS — M25551 Pain in right hip: Secondary | ICD-10-CM | POA: Diagnosis not present

## 2023-05-16 DIAGNOSIS — E1169 Type 2 diabetes mellitus with other specified complication: Secondary | ICD-10-CM | POA: Diagnosis not present

## 2023-05-16 DIAGNOSIS — J309 Allergic rhinitis, unspecified: Secondary | ICD-10-CM | POA: Diagnosis not present

## 2023-05-16 DIAGNOSIS — I10 Essential (primary) hypertension: Secondary | ICD-10-CM | POA: Diagnosis not present

## 2023-05-16 DIAGNOSIS — E7800 Pure hypercholesterolemia, unspecified: Secondary | ICD-10-CM | POA: Diagnosis not present

## 2023-05-28 ENCOUNTER — Ambulatory Visit: Payer: Medicare HMO | Admitting: Adult Health

## 2023-06-05 DIAGNOSIS — R519 Headache, unspecified: Secondary | ICD-10-CM | POA: Diagnosis not present

## 2023-06-05 DIAGNOSIS — M791 Myalgia, unspecified site: Secondary | ICD-10-CM | POA: Diagnosis not present

## 2023-06-05 DIAGNOSIS — R0602 Shortness of breath: Secondary | ICD-10-CM | POA: Diagnosis not present

## 2023-06-05 DIAGNOSIS — R509 Fever, unspecified: Secondary | ICD-10-CM | POA: Diagnosis not present

## 2023-06-05 DIAGNOSIS — R0981 Nasal congestion: Secondary | ICD-10-CM | POA: Diagnosis not present

## 2023-06-05 DIAGNOSIS — R051 Acute cough: Secondary | ICD-10-CM | POA: Diagnosis not present

## 2023-06-06 DIAGNOSIS — F3341 Major depressive disorder, recurrent, in partial remission: Secondary | ICD-10-CM | POA: Diagnosis not present

## 2023-06-06 DIAGNOSIS — E669 Obesity, unspecified: Secondary | ICD-10-CM | POA: Diagnosis not present

## 2023-06-06 DIAGNOSIS — N1832 Chronic kidney disease, stage 3b: Secondary | ICD-10-CM | POA: Diagnosis not present

## 2023-06-06 DIAGNOSIS — N1831 Chronic kidney disease, stage 3a: Secondary | ICD-10-CM | POA: Diagnosis not present

## 2023-06-06 DIAGNOSIS — I10 Essential (primary) hypertension: Secondary | ICD-10-CM | POA: Diagnosis not present

## 2023-06-06 DIAGNOSIS — I739 Peripheral vascular disease, unspecified: Secondary | ICD-10-CM | POA: Diagnosis not present

## 2023-06-06 DIAGNOSIS — D631 Anemia in chronic kidney disease: Secondary | ICD-10-CM | POA: Diagnosis not present

## 2023-06-06 DIAGNOSIS — E7800 Pure hypercholesterolemia, unspecified: Secondary | ICD-10-CM | POA: Diagnosis not present

## 2023-06-06 DIAGNOSIS — E1169 Type 2 diabetes mellitus with other specified complication: Secondary | ICD-10-CM | POA: Diagnosis not present

## 2023-06-06 DIAGNOSIS — J309 Allergic rhinitis, unspecified: Secondary | ICD-10-CM | POA: Diagnosis not present

## 2023-06-06 DIAGNOSIS — N2581 Secondary hyperparathyroidism of renal origin: Secondary | ICD-10-CM | POA: Diagnosis not present

## 2023-06-06 DIAGNOSIS — I129 Hypertensive chronic kidney disease with stage 1 through stage 4 chronic kidney disease, or unspecified chronic kidney disease: Secondary | ICD-10-CM | POA: Diagnosis not present

## 2023-06-06 DIAGNOSIS — R809 Proteinuria, unspecified: Secondary | ICD-10-CM | POA: Diagnosis not present

## 2023-06-06 DIAGNOSIS — K219 Gastro-esophageal reflux disease without esophagitis: Secondary | ICD-10-CM | POA: Diagnosis not present

## 2023-06-10 DIAGNOSIS — Z9181 History of falling: Secondary | ICD-10-CM | POA: Diagnosis not present

## 2023-06-10 DIAGNOSIS — Z0000 Encounter for general adult medical examination without abnormal findings: Secondary | ICD-10-CM | POA: Diagnosis not present

## 2023-06-16 DIAGNOSIS — N1832 Chronic kidney disease, stage 3b: Secondary | ICD-10-CM | POA: Diagnosis not present

## 2023-06-16 DIAGNOSIS — H353133 Nonexudative age-related macular degeneration, bilateral, advanced atrophic without subfoveal involvement: Secondary | ICD-10-CM | POA: Diagnosis not present

## 2023-06-17 DIAGNOSIS — N1832 Chronic kidney disease, stage 3b: Secondary | ICD-10-CM | POA: Diagnosis not present

## 2023-07-04 DIAGNOSIS — M25551 Pain in right hip: Secondary | ICD-10-CM | POA: Diagnosis not present

## 2023-07-13 ENCOUNTER — Other Ambulatory Visit: Payer: Self-pay | Admitting: Pulmonary Disease

## 2023-07-25 DIAGNOSIS — N1831 Chronic kidney disease, stage 3a: Secondary | ICD-10-CM | POA: Diagnosis not present

## 2023-07-25 DIAGNOSIS — J449 Chronic obstructive pulmonary disease, unspecified: Secondary | ICD-10-CM | POA: Diagnosis not present

## 2023-07-25 DIAGNOSIS — E1140 Type 2 diabetes mellitus with diabetic neuropathy, unspecified: Secondary | ICD-10-CM | POA: Diagnosis not present

## 2023-07-25 DIAGNOSIS — J309 Allergic rhinitis, unspecified: Secondary | ICD-10-CM | POA: Diagnosis not present

## 2023-07-25 DIAGNOSIS — E7800 Pure hypercholesterolemia, unspecified: Secondary | ICD-10-CM | POA: Diagnosis not present

## 2023-07-25 DIAGNOSIS — M159 Polyosteoarthritis, unspecified: Secondary | ICD-10-CM | POA: Diagnosis not present

## 2023-07-25 DIAGNOSIS — I739 Peripheral vascular disease, unspecified: Secondary | ICD-10-CM | POA: Diagnosis not present

## 2023-07-25 DIAGNOSIS — I10 Essential (primary) hypertension: Secondary | ICD-10-CM | POA: Diagnosis not present

## 2023-07-25 DIAGNOSIS — E1169 Type 2 diabetes mellitus with other specified complication: Secondary | ICD-10-CM | POA: Diagnosis not present

## 2023-07-25 DIAGNOSIS — K219 Gastro-esophageal reflux disease without esophagitis: Secondary | ICD-10-CM | POA: Diagnosis not present

## 2023-07-31 DIAGNOSIS — K219 Gastro-esophageal reflux disease without esophagitis: Secondary | ICD-10-CM | POA: Diagnosis not present

## 2023-07-31 DIAGNOSIS — E1169 Type 2 diabetes mellitus with other specified complication: Secondary | ICD-10-CM | POA: Diagnosis not present

## 2023-07-31 DIAGNOSIS — I739 Peripheral vascular disease, unspecified: Secondary | ICD-10-CM | POA: Diagnosis not present

## 2023-07-31 DIAGNOSIS — J309 Allergic rhinitis, unspecified: Secondary | ICD-10-CM | POA: Diagnosis not present

## 2023-07-31 DIAGNOSIS — E7800 Pure hypercholesterolemia, unspecified: Secondary | ICD-10-CM | POA: Diagnosis not present

## 2023-07-31 DIAGNOSIS — N1831 Chronic kidney disease, stage 3a: Secondary | ICD-10-CM | POA: Diagnosis not present

## 2023-07-31 DIAGNOSIS — M159 Polyosteoarthritis, unspecified: Secondary | ICD-10-CM | POA: Diagnosis not present

## 2023-07-31 DIAGNOSIS — J449 Chronic obstructive pulmonary disease, unspecified: Secondary | ICD-10-CM | POA: Diagnosis not present

## 2023-07-31 DIAGNOSIS — E1140 Type 2 diabetes mellitus with diabetic neuropathy, unspecified: Secondary | ICD-10-CM | POA: Diagnosis not present

## 2023-07-31 DIAGNOSIS — I10 Essential (primary) hypertension: Secondary | ICD-10-CM | POA: Diagnosis not present

## 2023-08-04 ENCOUNTER — Ambulatory Visit: Payer: Medicare HMO | Admitting: Adult Health

## 2023-08-06 DIAGNOSIS — M159 Polyosteoarthritis, unspecified: Secondary | ICD-10-CM | POA: Diagnosis not present

## 2023-08-06 DIAGNOSIS — R051 Acute cough: Secondary | ICD-10-CM | POA: Diagnosis not present

## 2023-08-06 DIAGNOSIS — R0981 Nasal congestion: Secondary | ICD-10-CM | POA: Diagnosis not present

## 2023-08-06 DIAGNOSIS — I10 Essential (primary) hypertension: Secondary | ICD-10-CM | POA: Diagnosis not present

## 2023-08-06 DIAGNOSIS — N1831 Chronic kidney disease, stage 3a: Secondary | ICD-10-CM | POA: Diagnosis not present

## 2023-08-06 DIAGNOSIS — R6889 Other general symptoms and signs: Secondary | ICD-10-CM | POA: Diagnosis not present

## 2023-08-06 DIAGNOSIS — I739 Peripheral vascular disease, unspecified: Secondary | ICD-10-CM | POA: Diagnosis not present

## 2023-08-06 DIAGNOSIS — J029 Acute pharyngitis, unspecified: Secondary | ICD-10-CM | POA: Diagnosis not present

## 2023-08-06 DIAGNOSIS — K219 Gastro-esophageal reflux disease without esophagitis: Secondary | ICD-10-CM | POA: Diagnosis not present

## 2023-08-06 DIAGNOSIS — Z20822 Contact with and (suspected) exposure to covid-19: Secondary | ICD-10-CM | POA: Diagnosis not present

## 2023-08-06 DIAGNOSIS — E7800 Pure hypercholesterolemia, unspecified: Secondary | ICD-10-CM | POA: Diagnosis not present

## 2023-08-06 DIAGNOSIS — E1140 Type 2 diabetes mellitus with diabetic neuropathy, unspecified: Secondary | ICD-10-CM | POA: Diagnosis not present

## 2023-08-06 DIAGNOSIS — J309 Allergic rhinitis, unspecified: Secondary | ICD-10-CM | POA: Diagnosis not present

## 2023-08-06 DIAGNOSIS — R070 Pain in throat: Secondary | ICD-10-CM | POA: Diagnosis not present

## 2023-08-07 ENCOUNTER — Ambulatory Visit: Payer: Medicare HMO | Admitting: Adult Health

## 2023-08-08 ENCOUNTER — Other Ambulatory Visit: Payer: Self-pay | Admitting: Pulmonary Disease

## 2023-08-09 DIAGNOSIS — R079 Chest pain, unspecified: Secondary | ICD-10-CM | POA: Diagnosis not present

## 2023-08-09 DIAGNOSIS — K529 Noninfective gastroenteritis and colitis, unspecified: Secondary | ICD-10-CM | POA: Diagnosis not present

## 2023-08-09 DIAGNOSIS — I2510 Atherosclerotic heart disease of native coronary artery without angina pectoris: Secondary | ICD-10-CM | POA: Diagnosis not present

## 2023-08-09 DIAGNOSIS — Z79899 Other long term (current) drug therapy: Secondary | ICD-10-CM | POA: Diagnosis not present

## 2023-08-09 DIAGNOSIS — R1084 Generalized abdominal pain: Secondary | ICD-10-CM | POA: Diagnosis not present

## 2023-08-09 DIAGNOSIS — I10 Essential (primary) hypertension: Secondary | ICD-10-CM | POA: Diagnosis not present

## 2023-08-09 DIAGNOSIS — Z20822 Contact with and (suspected) exposure to covid-19: Secondary | ICD-10-CM | POA: Diagnosis not present

## 2023-08-10 DIAGNOSIS — R110 Nausea: Secondary | ICD-10-CM | POA: Diagnosis not present

## 2023-08-10 DIAGNOSIS — R0602 Shortness of breath: Secondary | ICD-10-CM | POA: Diagnosis not present

## 2023-08-10 DIAGNOSIS — R1010 Upper abdominal pain, unspecified: Secondary | ICD-10-CM | POA: Diagnosis not present

## 2023-08-13 ENCOUNTER — Telehealth: Payer: Self-pay | Admitting: Neurology

## 2023-08-13 NOTE — Telephone Encounter (Signed)
Pt states when an EKG was done about 2-3 days ago and equipment was removed she states there was bruising and now when she tries to use her inspire remote it doesn't work properly, pt is asking for a call to discuss.

## 2023-08-15 ENCOUNTER — Ambulatory Visit: Payer: 59 | Admitting: Cardiology

## 2023-08-20 DIAGNOSIS — E1140 Type 2 diabetes mellitus with diabetic neuropathy, unspecified: Secondary | ICD-10-CM | POA: Diagnosis not present

## 2023-08-20 DIAGNOSIS — E7800 Pure hypercholesterolemia, unspecified: Secondary | ICD-10-CM | POA: Diagnosis not present

## 2023-08-20 DIAGNOSIS — K219 Gastro-esophageal reflux disease without esophagitis: Secondary | ICD-10-CM | POA: Diagnosis not present

## 2023-08-20 DIAGNOSIS — I10 Essential (primary) hypertension: Secondary | ICD-10-CM | POA: Diagnosis not present

## 2023-08-20 DIAGNOSIS — M159 Polyosteoarthritis, unspecified: Secondary | ICD-10-CM | POA: Diagnosis not present

## 2023-08-20 DIAGNOSIS — J309 Allergic rhinitis, unspecified: Secondary | ICD-10-CM | POA: Diagnosis not present

## 2023-08-20 DIAGNOSIS — J449 Chronic obstructive pulmonary disease, unspecified: Secondary | ICD-10-CM | POA: Diagnosis not present

## 2023-08-20 DIAGNOSIS — I739 Peripheral vascular disease, unspecified: Secondary | ICD-10-CM | POA: Diagnosis not present

## 2023-08-20 DIAGNOSIS — E1169 Type 2 diabetes mellitus with other specified complication: Secondary | ICD-10-CM | POA: Diagnosis not present

## 2023-08-20 DIAGNOSIS — N1831 Chronic kidney disease, stage 3a: Secondary | ICD-10-CM | POA: Diagnosis not present

## 2023-08-25 DIAGNOSIS — I739 Peripheral vascular disease, unspecified: Secondary | ICD-10-CM | POA: Diagnosis not present

## 2023-08-25 DIAGNOSIS — I10 Essential (primary) hypertension: Secondary | ICD-10-CM | POA: Diagnosis not present

## 2023-08-25 DIAGNOSIS — E1169 Type 2 diabetes mellitus with other specified complication: Secondary | ICD-10-CM | POA: Diagnosis not present

## 2023-08-25 DIAGNOSIS — E1140 Type 2 diabetes mellitus with diabetic neuropathy, unspecified: Secondary | ICD-10-CM | POA: Diagnosis not present

## 2023-08-25 DIAGNOSIS — E7800 Pure hypercholesterolemia, unspecified: Secondary | ICD-10-CM | POA: Diagnosis not present

## 2023-08-25 DIAGNOSIS — J340 Abscess, furuncle and carbuncle of nose: Secondary | ICD-10-CM | POA: Diagnosis not present

## 2023-08-25 DIAGNOSIS — M159 Polyosteoarthritis, unspecified: Secondary | ICD-10-CM | POA: Diagnosis not present

## 2023-08-25 DIAGNOSIS — K219 Gastro-esophageal reflux disease without esophagitis: Secondary | ICD-10-CM | POA: Diagnosis not present

## 2023-08-25 DIAGNOSIS — J309 Allergic rhinitis, unspecified: Secondary | ICD-10-CM | POA: Diagnosis not present

## 2023-08-25 DIAGNOSIS — N1831 Chronic kidney disease, stage 3a: Secondary | ICD-10-CM | POA: Diagnosis not present

## 2023-09-03 DIAGNOSIS — I739 Peripheral vascular disease, unspecified: Secondary | ICD-10-CM | POA: Diagnosis not present

## 2023-09-03 DIAGNOSIS — K589 Irritable bowel syndrome without diarrhea: Secondary | ICD-10-CM | POA: Diagnosis not present

## 2023-09-03 DIAGNOSIS — E7800 Pure hypercholesterolemia, unspecified: Secondary | ICD-10-CM | POA: Diagnosis not present

## 2023-09-03 DIAGNOSIS — E1140 Type 2 diabetes mellitus with diabetic neuropathy, unspecified: Secondary | ICD-10-CM | POA: Diagnosis not present

## 2023-09-03 DIAGNOSIS — K219 Gastro-esophageal reflux disease without esophagitis: Secondary | ICD-10-CM | POA: Diagnosis not present

## 2023-09-03 DIAGNOSIS — E1169 Type 2 diabetes mellitus with other specified complication: Secondary | ICD-10-CM | POA: Diagnosis not present

## 2023-09-03 DIAGNOSIS — N1831 Chronic kidney disease, stage 3a: Secondary | ICD-10-CM | POA: Diagnosis not present

## 2023-09-03 DIAGNOSIS — M159 Polyosteoarthritis, unspecified: Secondary | ICD-10-CM | POA: Diagnosis not present

## 2023-09-03 DIAGNOSIS — J309 Allergic rhinitis, unspecified: Secondary | ICD-10-CM | POA: Diagnosis not present

## 2023-09-03 DIAGNOSIS — I10 Essential (primary) hypertension: Secondary | ICD-10-CM | POA: Diagnosis not present

## 2023-09-04 NOTE — Telephone Encounter (Signed)
Finally reached patient by phone. Pt ended up on level 6 of stimulation (supposed to be on level 3). Instructed pt step by step to get back to level 3. Pt to use through the weekend and call back on Monday if it doesn't feel right.

## 2023-09-05 ENCOUNTER — Ambulatory Visit: Payer: 59 | Admitting: Cardiology

## 2023-09-15 DIAGNOSIS — M6281 Muscle weakness (generalized): Secondary | ICD-10-CM | POA: Diagnosis not present

## 2023-09-15 DIAGNOSIS — E1140 Type 2 diabetes mellitus with diabetic neuropathy, unspecified: Secondary | ICD-10-CM | POA: Diagnosis not present

## 2023-09-15 DIAGNOSIS — J9611 Chronic respiratory failure with hypoxia: Secondary | ICD-10-CM | POA: Diagnosis not present

## 2023-09-15 DIAGNOSIS — R531 Weakness: Secondary | ICD-10-CM | POA: Diagnosis not present

## 2023-09-15 DIAGNOSIS — Z7902 Long term (current) use of antithrombotics/antiplatelets: Secondary | ICD-10-CM | POA: Diagnosis not present

## 2023-09-15 DIAGNOSIS — I739 Peripheral vascular disease, unspecified: Secondary | ICD-10-CM | POA: Diagnosis not present

## 2023-09-15 DIAGNOSIS — R2681 Unsteadiness on feet: Secondary | ICD-10-CM | POA: Diagnosis not present

## 2023-09-15 DIAGNOSIS — R0602 Shortness of breath: Secondary | ICD-10-CM | POA: Diagnosis not present

## 2023-09-15 DIAGNOSIS — R200 Anesthesia of skin: Secondary | ICD-10-CM | POA: Diagnosis not present

## 2023-09-15 DIAGNOSIS — K589 Irritable bowel syndrome without diarrhea: Secondary | ICD-10-CM | POA: Diagnosis not present

## 2023-09-15 DIAGNOSIS — M545 Low back pain, unspecified: Secondary | ICD-10-CM | POA: Diagnosis not present

## 2023-09-15 DIAGNOSIS — M159 Polyosteoarthritis, unspecified: Secondary | ICD-10-CM | POA: Diagnosis not present

## 2023-09-15 DIAGNOSIS — J4489 Other specified chronic obstructive pulmonary disease: Secondary | ICD-10-CM | POA: Diagnosis not present

## 2023-09-15 DIAGNOSIS — J0100 Acute maxillary sinusitis, unspecified: Secondary | ICD-10-CM | POA: Diagnosis not present

## 2023-09-15 DIAGNOSIS — Z955 Presence of coronary angioplasty implant and graft: Secondary | ICD-10-CM | POA: Diagnosis not present

## 2023-09-15 DIAGNOSIS — Z880 Allergy status to penicillin: Secondary | ICD-10-CM | POA: Diagnosis not present

## 2023-09-15 DIAGNOSIS — J8410 Pulmonary fibrosis, unspecified: Secondary | ICD-10-CM | POA: Diagnosis not present

## 2023-09-15 DIAGNOSIS — K439 Ventral hernia without obstruction or gangrene: Secondary | ICD-10-CM | POA: Diagnosis not present

## 2023-09-15 DIAGNOSIS — R9082 White matter disease, unspecified: Secondary | ICD-10-CM | POA: Diagnosis not present

## 2023-09-15 DIAGNOSIS — N261 Atrophy of kidney (terminal): Secondary | ICD-10-CM | POA: Diagnosis not present

## 2023-09-15 DIAGNOSIS — E7800 Pure hypercholesterolemia, unspecified: Secondary | ICD-10-CM | POA: Diagnosis not present

## 2023-09-15 DIAGNOSIS — J309 Allergic rhinitis, unspecified: Secondary | ICD-10-CM | POA: Diagnosis not present

## 2023-09-15 DIAGNOSIS — N1831 Chronic kidney disease, stage 3a: Secondary | ICD-10-CM | POA: Diagnosis not present

## 2023-09-15 DIAGNOSIS — I2510 Atherosclerotic heart disease of native coronary artery without angina pectoris: Secondary | ICD-10-CM | POA: Diagnosis not present

## 2023-09-15 DIAGNOSIS — G8929 Other chronic pain: Secondary | ICD-10-CM | POA: Diagnosis not present

## 2023-09-15 DIAGNOSIS — Z79899 Other long term (current) drug therapy: Secondary | ICD-10-CM | POA: Diagnosis not present

## 2023-09-15 DIAGNOSIS — E119 Type 2 diabetes mellitus without complications: Secondary | ICD-10-CM | POA: Diagnosis not present

## 2023-09-15 DIAGNOSIS — I10 Essential (primary) hypertension: Secondary | ICD-10-CM | POA: Diagnosis not present

## 2023-09-15 DIAGNOSIS — G4733 Obstructive sleep apnea (adult) (pediatric): Secondary | ICD-10-CM | POA: Diagnosis not present

## 2023-09-15 DIAGNOSIS — E1169 Type 2 diabetes mellitus with other specified complication: Secondary | ICD-10-CM | POA: Diagnosis not present

## 2023-09-15 DIAGNOSIS — Z7984 Long term (current) use of oral hypoglycemic drugs: Secondary | ICD-10-CM | POA: Diagnosis not present

## 2023-09-15 DIAGNOSIS — E1151 Type 2 diabetes mellitus with diabetic peripheral angiopathy without gangrene: Secondary | ICD-10-CM | POA: Diagnosis not present

## 2023-09-15 DIAGNOSIS — R079 Chest pain, unspecified: Secondary | ICD-10-CM | POA: Diagnosis not present

## 2023-09-15 DIAGNOSIS — R918 Other nonspecific abnormal finding of lung field: Secondary | ICD-10-CM | POA: Diagnosis not present

## 2023-09-15 DIAGNOSIS — G459 Transient cerebral ischemic attack, unspecified: Secondary | ICD-10-CM | POA: Diagnosis not present

## 2023-09-15 DIAGNOSIS — R42 Dizziness and giddiness: Secondary | ICD-10-CM | POA: Diagnosis not present

## 2023-09-15 DIAGNOSIS — J479 Bronchiectasis, uncomplicated: Secondary | ICD-10-CM | POA: Diagnosis not present

## 2023-09-15 DIAGNOSIS — K219 Gastro-esophageal reflux disease without esophagitis: Secondary | ICD-10-CM | POA: Diagnosis not present

## 2023-09-15 DIAGNOSIS — Z7985 Long-term (current) use of injectable non-insulin antidiabetic drugs: Secondary | ICD-10-CM | POA: Diagnosis not present

## 2023-09-15 DIAGNOSIS — R110 Nausea: Secondary | ICD-10-CM | POA: Diagnosis not present

## 2023-09-16 DIAGNOSIS — I517 Cardiomegaly: Secondary | ICD-10-CM | POA: Diagnosis not present

## 2023-09-16 DIAGNOSIS — G459 Transient cerebral ischemic attack, unspecified: Secondary | ICD-10-CM | POA: Diagnosis not present

## 2023-09-16 DIAGNOSIS — I3481 Nonrheumatic mitral (valve) annulus calcification: Secondary | ICD-10-CM | POA: Diagnosis not present

## 2023-09-16 DIAGNOSIS — E785 Hyperlipidemia, unspecified: Secondary | ICD-10-CM | POA: Diagnosis not present

## 2023-09-16 DIAGNOSIS — E119 Type 2 diabetes mellitus without complications: Secondary | ICD-10-CM | POA: Diagnosis not present

## 2023-09-16 DIAGNOSIS — I639 Cerebral infarction, unspecified: Secondary | ICD-10-CM | POA: Diagnosis not present

## 2023-09-16 DIAGNOSIS — G4733 Obstructive sleep apnea (adult) (pediatric): Secondary | ICD-10-CM | POA: Diagnosis not present

## 2023-09-16 DIAGNOSIS — I10 Essential (primary) hypertension: Secondary | ICD-10-CM | POA: Diagnosis not present

## 2023-09-17 DIAGNOSIS — R9082 White matter disease, unspecified: Secondary | ICD-10-CM | POA: Diagnosis not present

## 2023-09-17 DIAGNOSIS — G459 Transient cerebral ischemic attack, unspecified: Secondary | ICD-10-CM | POA: Diagnosis not present

## 2023-09-18 DIAGNOSIS — J9611 Chronic respiratory failure with hypoxia: Secondary | ICD-10-CM | POA: Diagnosis not present

## 2023-09-18 DIAGNOSIS — Z9682 Presence of neurostimulator: Secondary | ICD-10-CM | POA: Diagnosis not present

## 2023-09-18 DIAGNOSIS — G8929 Other chronic pain: Secondary | ICD-10-CM | POA: Diagnosis not present

## 2023-09-18 DIAGNOSIS — Z955 Presence of coronary angioplasty implant and graft: Secondary | ICD-10-CM | POA: Diagnosis not present

## 2023-09-18 DIAGNOSIS — Z7902 Long term (current) use of antithrombotics/antiplatelets: Secondary | ICD-10-CM | POA: Diagnosis not present

## 2023-09-18 DIAGNOSIS — J309 Allergic rhinitis, unspecified: Secondary | ICD-10-CM | POA: Diagnosis not present

## 2023-09-18 DIAGNOSIS — Z7984 Long term (current) use of oral hypoglycemic drugs: Secondary | ICD-10-CM | POA: Diagnosis not present

## 2023-09-18 DIAGNOSIS — G4733 Obstructive sleep apnea (adult) (pediatric): Secondary | ICD-10-CM | POA: Diagnosis not present

## 2023-09-18 DIAGNOSIS — M545 Low back pain, unspecified: Secondary | ICD-10-CM | POA: Diagnosis not present

## 2023-09-18 DIAGNOSIS — I739 Peripheral vascular disease, unspecified: Secondary | ICD-10-CM | POA: Diagnosis not present

## 2023-09-18 DIAGNOSIS — K589 Irritable bowel syndrome without diarrhea: Secondary | ICD-10-CM | POA: Diagnosis not present

## 2023-09-18 DIAGNOSIS — E1140 Type 2 diabetes mellitus with diabetic neuropathy, unspecified: Secondary | ICD-10-CM | POA: Diagnosis not present

## 2023-09-18 DIAGNOSIS — Z7982 Long term (current) use of aspirin: Secondary | ICD-10-CM | POA: Diagnosis not present

## 2023-09-18 DIAGNOSIS — J8410 Pulmonary fibrosis, unspecified: Secondary | ICD-10-CM | POA: Diagnosis not present

## 2023-09-18 DIAGNOSIS — I2510 Atherosclerotic heart disease of native coronary artery without angina pectoris: Secondary | ICD-10-CM | POA: Diagnosis not present

## 2023-09-18 DIAGNOSIS — Z87891 Personal history of nicotine dependence: Secondary | ICD-10-CM | POA: Diagnosis not present

## 2023-09-18 DIAGNOSIS — Z7951 Long term (current) use of inhaled steroids: Secondary | ICD-10-CM | POA: Diagnosis not present

## 2023-09-18 DIAGNOSIS — N1831 Chronic kidney disease, stage 3a: Secondary | ICD-10-CM | POA: Diagnosis not present

## 2023-09-18 DIAGNOSIS — I10 Essential (primary) hypertension: Secondary | ICD-10-CM | POA: Diagnosis not present

## 2023-09-18 DIAGNOSIS — I679 Cerebrovascular disease, unspecified: Secondary | ICD-10-CM | POA: Diagnosis not present

## 2023-09-18 DIAGNOSIS — M797 Fibromyalgia: Secondary | ICD-10-CM | POA: Diagnosis not present

## 2023-09-18 DIAGNOSIS — J4489 Other specified chronic obstructive pulmonary disease: Secondary | ICD-10-CM | POA: Diagnosis not present

## 2023-09-18 DIAGNOSIS — K219 Gastro-esophageal reflux disease without esophagitis: Secondary | ICD-10-CM | POA: Diagnosis not present

## 2023-09-18 DIAGNOSIS — E7800 Pure hypercholesterolemia, unspecified: Secondary | ICD-10-CM | POA: Diagnosis not present

## 2023-09-18 DIAGNOSIS — M159 Polyosteoarthritis, unspecified: Secondary | ICD-10-CM | POA: Diagnosis not present

## 2023-09-18 DIAGNOSIS — E1151 Type 2 diabetes mellitus with diabetic peripheral angiopathy without gangrene: Secondary | ICD-10-CM | POA: Diagnosis not present

## 2023-09-18 DIAGNOSIS — Z9981 Dependence on supplemental oxygen: Secondary | ICD-10-CM | POA: Diagnosis not present

## 2023-09-18 DIAGNOSIS — I6529 Occlusion and stenosis of unspecified carotid artery: Secondary | ICD-10-CM | POA: Diagnosis not present

## 2023-09-18 DIAGNOSIS — G8194 Hemiplegia, unspecified affecting left nondominant side: Secondary | ICD-10-CM | POA: Diagnosis not present

## 2023-09-19 DIAGNOSIS — Z8673 Personal history of transient ischemic attack (TIA), and cerebral infarction without residual deficits: Secondary | ICD-10-CM | POA: Diagnosis not present

## 2023-09-19 DIAGNOSIS — E782 Mixed hyperlipidemia: Secondary | ICD-10-CM | POA: Diagnosis not present

## 2023-09-19 DIAGNOSIS — E1141 Type 2 diabetes mellitus with diabetic mononeuropathy: Secondary | ICD-10-CM | POA: Diagnosis not present

## 2023-09-19 DIAGNOSIS — I10 Essential (primary) hypertension: Secondary | ICD-10-CM | POA: Diagnosis not present

## 2023-09-22 ENCOUNTER — Ambulatory Visit: Payer: Medicare HMO | Admitting: Adult Health

## 2023-09-22 ENCOUNTER — Telehealth: Payer: Self-pay | Admitting: Adult Health

## 2023-09-22 ENCOUNTER — Encounter: Payer: Self-pay | Admitting: Adult Health

## 2023-09-22 VITALS — BP 124/76 | HR 92 | Temp 98.0°F | Ht 60.0 in | Wt 186.8 lb

## 2023-09-22 DIAGNOSIS — J849 Interstitial pulmonary disease, unspecified: Secondary | ICD-10-CM

## 2023-09-22 DIAGNOSIS — J8410 Pulmonary fibrosis, unspecified: Secondary | ICD-10-CM | POA: Diagnosis not present

## 2023-09-22 DIAGNOSIS — I2510 Atherosclerotic heart disease of native coronary artery without angina pectoris: Secondary | ICD-10-CM | POA: Diagnosis not present

## 2023-09-22 DIAGNOSIS — I739 Peripheral vascular disease, unspecified: Secondary | ICD-10-CM

## 2023-09-22 DIAGNOSIS — G4733 Obstructive sleep apnea (adult) (pediatric): Secondary | ICD-10-CM

## 2023-09-22 DIAGNOSIS — J9611 Chronic respiratory failure with hypoxia: Secondary | ICD-10-CM

## 2023-09-22 HISTORY — DX: Interstitial pulmonary disease, unspecified: J84.9

## 2023-09-22 HISTORY — DX: Obstructive sleep apnea (adult) (pediatric): G47.33

## 2023-09-22 NOTE — Telephone Encounter (Signed)
 Patient states needs order for rollator. Patient states getting thru [employer]. Patient phone number is [phone].

## 2023-09-22 NOTE — Patient Instructions (Addendum)
 Wear Oxygen 2 L with activity and at bedtime, keep O2 sats >88-90%.  AYR saline nasal gel Twice daily  .  Albuterol inhaler 1-2 puffs every 4-6hr as needed.  Activity as tolerated Continue on inspire at bedtime, keep follow up with Neurology.  PT/OT as planned this week.  Labs today .  Follow up with Dr. Vassie Loll or Lori Whetsell NP in 6-8 weeks and As needed  with PFT  Please contact office for sooner follow up if symptoms do not improve or worsen or seek emergency care

## 2023-09-22 NOTE — Assessment & Plan Note (Signed)
 Interstitial lung disease changes on CT chest questionable etiology.  CT changes dating back to 2005.  With mild progression.  Will check autoimmune/connective tissue serology.,  ACE level.  Check PFTs on return visit.  Patient education given to patient and her family  Plan  Patient Instructions  Wear Oxygen 2 L with activity and at bedtime, keep O2 sats >88-90%.  AYR saline nasal gel Twice daily  .  Albuterol inhaler 1-2 puffs every 4-6hr as needed.  Activity as tolerated Continue on inspire at bedtime, keep follow up with Neurology.  PT/OT as planned this week.  Labs today .  Follow up with Dr. Vassie Loll or Sorrel Cassetta NP in 6-8 weeks and As needed  with PFT  Please contact office for sooner follow up if symptoms do not improve or worsen or seek emergency care

## 2023-09-22 NOTE — Progress Notes (Signed)
 @Patient  ID: Lori Byrd, female    DOB: 1951-01-01, 73 y.o.   MRN: [id number]  No chief complaint on file.   Referring provider: Mikki Santee Key, *  HPI: 73 year old female former smoker seen for pulmonary consult October 09, 2021 for nocturnal hypoxemia found to have underlying interstitial lung disease. Medical history significant for CAD and DM   TEST/EVENTS :  Home sleep study January 18, 2019 showed severe sleep apnea with AHI at 53/hour with frequent episodes of hypoxemia sleep apnea is worst in the prone position with AHI at 65.8/hour   2 D echo 10/24/21 -2D echo shows some mild grade 1 diastolic dysfunction-this is some mild stiffness of the heart.  Pulm function is normal.  Normal pulmonary artery systolic pressure.  Mild thickening of the mitral valve no stenosis.  No regurg. RV size is mildly enlarged and mildly reduced systolic function. (D Dimer was neg)    2D echo with bubble study Dec 06, 2021 negative.   Stress Myoview March 26, 2022 normal, low risk study, EF 72%.   PFT Nov 26, 2021 showed FEV1 at 96%, ratio 84, FVC 87%, no significant bronchodilator response, DLCO 67%.   ONO showed significant desat > 4h   High-resolution CT chest August 14, 2022 showed upper lobe predominant subpleural reticular opacities with traction bronchiectasis and honeycombing changes.  Findings have been present dating back to 2005 with a slow progression.  5 mm right lower lobe nodule.  Unchanged from 2015.  Consistent with a benign etiology.  Findings are suggestive of alternative diagnosis not UIP.  Possible fibrotic sarcoidosis or fibrotic hypersensitivity pneumonitis is a additional consideration.  ILD w/up   No history of amiodarone , macrodantin use. No history of autoimmune. Has 1 dog. No birds/chicken. No hot tub. Has basement-does not use-closed off.    09/22/2023 Follow up : ILD , Post hospital follow up, O2 RF  Patient presents for a follow-up visit.  She was  last seen in office in January 2024.  Patient was recently hospitalized at [facility].  She has been having Balance issues and falls with memory changes. 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 Discharged with PT and OT to start this week. Started on Aspirin and Statin. Following with neurology.  She was continued on Plavix with underlying history of coronary artery disease. Patient has a history of sleep apnea has the inspire device.  Followed by neurology.  Uses her inspire every single night.  Patient was having ongoing nocturnal hypoxemia was referred to our office.  Walk test in the office in March 2023 showed exertional hypoxemia as well.  She was started on oxygen with activity.  Pulmonary function testing showed no significant airflow obstruction or restriction.  Decreased diffusing capacity.  2D echo showed grade 1 diastolic dysfunction with normal pulmonary artery pressures.  She had no history of amiodarone, Macrodantin use.  No history of autoimmune disorder.   Patient was recommended for follow-up visit with autoimmune labs.  She is returned today after recent hospitalization for acute CVA.  Patient says she is doing better since discharge.  She is starting physical therapy and Occupational Therapy this week.  She has been discharged home.  Family is helping her at home.  She does have some left-sided residual weakness and mild speech impairment.  She is using a walker now. She denies any flare of cough or wheezing.  Does get short of breath with heavy activities.  Allergies  Allergen Reactions   Penicillins Anaphylaxis    Did it involve swelling of the face/tongue/throat, SOB, or low BP? Yes Did it involve sudden or severe rash/hives, skin peeling, or any reaction on the inside of your mouth or nose? Yes Did you need to seek medical attention at a hospital or doctor's office? Yes When did it last happen? childhood      If all above  answers are "NO", may proceed with cephalosporin use.    Asa [Aspirin] Other (See Comments)    Per Dr not to take; on Plavix   Toradol [Ketorolac Tromethamine] Other (See Comments)    Told by MD not to take; on Plavix    Morphine And Codeine Other (See Comments)    Pt preference    Tramadol Other (See Comments)    Pt does not like how this makes her feel    Immunization History  Administered Date(s) Administered   Fluad Quad(high Dose 65+) 05/21/2021   Influenza, High Dose Seasonal PF 05/05/2019, 05/31/2019   PFIZER(Purple Top)SARS-COV-2 Vaccination 10/15/2019, 11/12/2019   Pneumococcal Conjugate-13 04/19/2015, 05/26/2017   Tdap 04/19/2015   Zoster Recombinant(Shingrix) 04/19/2018, 08/22/2018   Zoster, Live 11/18/2012    Past Medical History:  Diagnosis Date   Acquired thrombophilia (HCC) 05/30/2021   Anemia 07/08/2015   Anxiety    Arteriosclerosis of both carotid arteries 07/08/2015   Arthritis    B12 deficiency    Bilateral bunions 07/08/2015   Bilateral carotid artery stenosis 08/11/2018   BMI 36.0-36.9,adult    Cancer (HCC)    skin cancer   Cardiac murmur 03/12/2022   Carotid artery stenosis    Chronic intermittent hypoxia with obstructive sleep apnea 06/22/2020   Chronic low back pain 07/08/2015   Chronic respiratory failure with hypoxia (HCC) 10/09/2021   Claudication (HCC)    COPD (chronic obstructive pulmonary disease) (HCC)    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/10/2018   Corn or callus 08/28/2017   Coronary arteriosclerosis 05/30/2021   Coronary artery disease    COVID-19 virus infection 11/21/2018   CPAP/BiPAP dependence 07/08/2015   Degenerative lumbar spinal stenosis 03/02/2020   Depression    Diabetes mellitus without complication (HCC)    Diabetic polyneuropathy associated with type 2 diabetes mellitus (HCC) 11/10/2018   DOE (dyspnea on exertion) 03/12/2022   Fibromyalgia    Fibromyalgia    GERD  (gastroesophageal reflux disease)    Heart disease    Hyperlipidemia associated with type 2 diabetes mellitus (HCC)    Hypertension    IBS (irritable bowel syndrome)    Innominate artery stenosis (HCC) 07/08/2015   Formatting of this note might be different from the original. Open stent in 11/2012 and restent in 07/2013   Intolerance of continuous positive airway pressure (CPAP) ventilation 11/10/2018   Lumbar radiculopathy 12/29/2020   Lumbar spondylosis 01/25/2021   Macular degeneration    Neuropathic pain of both feet 07/08/2015   Nodule of right lung 07/08/2015   Obesity 07/08/2015   Obesity (BMI 35.0-39.9 without comorbidity) 03/12/2022   Obstructive sleep apnea 02/19/2019   Osteopenia 12/08/2018   PAD (peripheral artery disease) (HCC) 11/10/2018   Peripheral vascular angioplasty status with implants and grafts 07/08/2015   Peripheral vascular disease (HCC) 03/02/2020   Primary fibromyalgia syndrome 03/02/2020   Recurrent insomnia 08/13/2019   Restless legs syndrome (RLS) 02/21/2016   Spondylolisthesis, grade 1 06/19/2020   Statin intolerance    Status post arterial stent 11/10/2018   Status post insertion of  hypoglossal nerve stimulator 09/11/2021   Status post insertion of nerve stimulator 06/22/2020   Type 2 diabetes mellitus with diabetic mononeuropathy, without long-term current use of insulin (HCC) 12/05/2015   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 18 each 1   amLODipine (NORVASC) 10 MG tablet Take 10 mg by  mouth daily.     clopidogrel (PLAVIX) 75 MG tablet Take 1 tablet (75 mg total) by mouth daily. Restart plavix on 07/03/19 30 tablet 12   Coenzyme Q10 (COQ10) 100 MG CAPS Take 100 mg by mouth daily.     diclofenac Sodium (VOLTAREN) 1 % GEL Apply 1 Application topically as needed for pain.     estradiol (ESTRACE) 0.1 MG/GM vaginal cream Place 1 Applicatorful vaginally once a week.     ezetimibe (ZETIA) 10 MG tablet Take 10 mg by mouth daily.     FARXIGA 5 MG TABS tablet Take 5 mg by mouth every morning.     fluticasone (FLONASE) 50 MCG/ACT nasal spray Place 1 spray into both nostrils daily.     gabapentin (NEURONTIN) 100 MG capsule Take 200 mg by mouth at bedtime.     meclizine (ANTIVERT) 25 MG tablet Take 25-50 mg by mouth 2 (two) times daily.     metoprolol succinate (TOPROL-XL) 100 MG 24 hr tablet Take 100 mg by mouth daily.     nitroGLYCERIN (NITROSTAT) 0.4 MG SL tablet Place 0.4 mg under the tongue every 5 (five) minutes as needed for chest pain.     OZEMPIC, 0.25 OR 0.5 MG/DOSE, 2 MG/3ML SOPN Inject 0.25 mg into the skin once a week.     pantoprazole (PROTONIX) 40 MG tablet Take 40 mg by mouth every morning.      RESTASIS 0.05 % ophthalmic emulsion Place 1 drop into both eyes 2 (two) times daily.     venlafaxine XR (EFFEXOR-XR) 75 MG 24 hr capsule Take 75 mg by mouth at bedtime.      busPIRone (BUSPAR) 5 MG tablet Take 5 mg by mouth every 8 (eight) hours as needed for anxiety. (Patient not taking: Reported on 09/22/2023)     fexofenadine (ALLEGRA) 180 MG tablet Take 180 mg by mouth daily. (Patient not taking: Reported on 09/22/2023)     hydrochlorothiazide (HYDRODIURIL) 12.5 MG tablet Take 12.5 mg by mouth every morning. (Patient not taking: Reported on 09/22/2023)     Multiple Vitamin (MULTI-VITAMIN PO) Take 1 tablet by mouth daily.  (Patient not taking: Reported on 09/22/2023)     No facility-administered medications prior to visit.     Review of Systems:   Constitutional:   No  weight loss,  night sweats,  Fevers, chills, +fatigue, or  lassitude.  HEENT:   No headaches,  Difficulty swallowing,  Tooth/dental problems, or  Sore throat,                No sneezing, itching, ear ache, nasal congestion, post nasal drip,   CV:  No chest pain,  Orthopnea, PND, swelling in lower extremities, anasarca, dizziness, palpitations, syncope.  GI  No heartburn, indigestion, abdominal pain, nausea, vomiting, diarrhea, change in bowel habits, loss of appetite, bloody stools.   Resp:  No chest wall deformity  Skin: no rash or lesions.  GU: no dysuria, change in color of urine, no urgency or frequency.  No flank pain, no hematuria   MS:  No joint pain or swelling.  No decreased range of motion.  No back pain.    Physical Exam  BP 124/76 (BP Location: Left Arm, Patient Position: Sitting)   Pulse 92   Temp 98 F (36.7 C) (Oral)   Ht 5' (1.524 m)   Wt 186 lb 12.8 oz (84.7 kg)   SpO2 92%   BMI 36.48 kg/m   GEN: A/Ox3; pleasant , NAD, elderly, walker   HEENT:  [place]/AT,  , NOSE-clear, THROAT-clear, no lesions, no postnasal drip or exudate noted.   NECK:  Supple w/ fair ROM; no JVD; normal carotid impulses w/o bruits; no thyromegaly or nodules palpated; no lymphadenopathy.    RESP  Clear  P & A; w/o, wheezes/ rales/ or rhonchi. no accessory muscle use, no dullness to percussion  CARD:  RRR, no m/r/g, no peripheral edema, pulses intact, no cyanosis or clubbing.  GI:   Soft & nt; nml bowel sounds; no organomegaly or masses detected.   Musco: Warm bil, no deformities or joint swelling noted.   Neuro: alert, no focal deficits noted.    Skin: Warm, no lesions or rashes    Lab Results:  CBC    Component Value Date/Time   WBC 4.5 10/09/2021 1157   RBC 4.63 10/09/2021 1157   HGB 13.2 10/09/2021 1157   HCT 40.5 10/09/2021 1157   PLT 269.0 10/09/2021 1157   MCV 87.5 10/09/2021 1157   MCH 29.2 06/28/2019 0820   MCHC 32.7 10/09/2021 1157   RDW 14.9 10/09/2021 1157   LYMPHSABS 1.6  10/09/2021 1157   MONOABS 0.4 10/09/2021 1157   EOSABS 0.2 10/09/2021 1157   BASOSABS 0.0 10/09/2021 1157    BMET    Component Value Date/Time   NA 137 10/09/2021 1157   K 4.1 10/09/2021 1157   CL 97 10/09/2021 1157   CO2 30 10/09/2021 1157   GLUCOSE 103 (H) 10/09/2021 1157   BUN 16 10/09/2021 1157   CREATININE 0.99 10/09/2021 1157   CALCIUM 10.0 10/09/2021 1157   GFRNONAA >60 06/28/2019 0820   GFRAA >60 06/28/2019 0820    BNP    Component Value Date/Time   BNP 18.7 11/22/2018 0830    ProBNP    Component Value Date/Time   PROBNP 38.0 10/09/2021 1157    Imaging: No results found.  Administration History     None          Latest Ref Rng & Units 11/26/2021    1:10 PM  PFT Results  FVC-Pre L 2.31   FVC-Predicted Pre % 87   FVC-Post L 2.30   FVC-Predicted Post % 87   Pre FEV1/FVC % % 82   Post FEV1/FCV % % 84   FEV1-Pre L 1.90   FEV1-Predicted Pre % 95   FEV1-Post L 1.92   DLCO uncorrected ml/min/mmHg 11.80   DLCO UNC% % 67   DLCO corrected ml/min/mmHg 11.87   DLCO COR %Predicted % 67   DLVA Predicted % 83   TLC L 4.02   TLC % Predicted % 87   RV % Predicted % 81     No results found for: "NITRICOXIDE"      Assessment &  Plan:   ILD (interstitial lung disease) (HCC) Interstitial lung disease changes on CT chest questionable etiology.  CT changes dating back to 2005.  With mild progression.  Will check autoimmune/connective tissue serology.,  ACE level.  Check PFTs on return visit.  Patient education given to patient and her family  Plan  Patient Instructions  Wear Oxygen 2 L with activity and at bedtime, keep O2 sats >88-90%.  AYR saline nasal gel Twice daily  .  Albuterol inhaler 1-2 puffs every 4-6hr as needed.  Activity as tolerated Continue on inspire at bedtime, keep follow up with Neurology.  PT/OT as planned this week.  Labs today .  Follow up with Dr. Vassie Loll or Malayja Freund NP in 6-8 weeks and As needed  with PFT  Please contact office  for sooner follow up if symptoms do not improve or worsen or seek emergency care     Chronic respiratory failure with hypoxia (HCC) Continue on oxygen to maintain O2 saturations greater than 88 to 9%.  Plan  Patient Instructions  Wear Oxygen 2 L with activity and at bedtime, keep O2 sats >88-90%.  AYR saline nasal gel Twice daily  .  Albuterol inhaler 1-2 puffs every 4-6hr as needed.  Activity as tolerated Continue on inspire at bedtime, keep follow up with Neurology.  PT/OT as planned this week.  Labs today .  Follow up with Dr. Vassie Loll or Ezri Fanguy NP in 6-8 weeks and As needed  with PFT  Please contact office for sooner follow up if symptoms do not improve or worsen or seek emergency care     Peripheral vascular disease [facility]) Recent admission for acute CVA.  Continue follow-up with neurology.  PT and OT as planned this week.  Coronary artery disease Follow-up with cardiology.  OSA (obstructive sleep apnea) Continue with inspire device at bedtime.  Follow-up with neurology as planned     Rubye Oaks, NP 09/22/2023

## 2023-09-22 NOTE — Assessment & Plan Note (Signed)
 Continue on oxygen to maintain O2 saturations greater than 88 to 9%.  Plan  Patient Instructions  Wear Oxygen 2 L with activity and at bedtime, keep O2 sats >88-90%.  AYR saline nasal gel Twice daily  .  Albuterol inhaler 1-2 puffs every 4-6hr as needed.  Activity as tolerated Continue on inspire at bedtime, keep follow up with Neurology.  PT/OT as planned this week.  Labs today .  Follow up with Dr. Vassie Loll or Danely Bayliss NP in 6-8 weeks and As needed  with PFT  Please contact office for sooner follow up if symptoms do not improve or worsen or seek emergency care

## 2023-09-22 NOTE — Assessment & Plan Note (Signed)
 Recent admission for acute CVA.  Continue follow-up with neurology.  PT and OT as planned this week.

## 2023-09-22 NOTE — Assessment & Plan Note (Signed)
 Follow up with cardiology

## 2023-09-22 NOTE — Assessment & Plan Note (Signed)
 Continue with inspire device at bedtime.  Follow-up with neurology as planned

## 2023-09-24 ENCOUNTER — Telehealth: Payer: Self-pay | Admitting: Adult Health

## 2023-09-24 DIAGNOSIS — I739 Peripheral vascular disease, unspecified: Secondary | ICD-10-CM | POA: Diagnosis not present

## 2023-09-24 DIAGNOSIS — Z7902 Long term (current) use of antithrombotics/antiplatelets: Secondary | ICD-10-CM | POA: Diagnosis not present

## 2023-09-24 DIAGNOSIS — M545 Low back pain, unspecified: Secondary | ICD-10-CM | POA: Diagnosis not present

## 2023-09-24 DIAGNOSIS — M797 Fibromyalgia: Secondary | ICD-10-CM | POA: Diagnosis not present

## 2023-09-24 DIAGNOSIS — Z7982 Long term (current) use of aspirin: Secondary | ICD-10-CM | POA: Diagnosis not present

## 2023-09-24 DIAGNOSIS — N1831 Chronic kidney disease, stage 3a: Secondary | ICD-10-CM | POA: Diagnosis not present

## 2023-09-24 DIAGNOSIS — I6529 Occlusion and stenosis of unspecified carotid artery: Secondary | ICD-10-CM | POA: Diagnosis not present

## 2023-09-24 DIAGNOSIS — E1151 Type 2 diabetes mellitus with diabetic peripheral angiopathy without gangrene: Secondary | ICD-10-CM | POA: Diagnosis not present

## 2023-09-24 DIAGNOSIS — Z7984 Long term (current) use of oral hypoglycemic drugs: Secondary | ICD-10-CM | POA: Diagnosis not present

## 2023-09-24 DIAGNOSIS — E7800 Pure hypercholesterolemia, unspecified: Secondary | ICD-10-CM | POA: Diagnosis not present

## 2023-09-24 DIAGNOSIS — M159 Polyosteoarthritis, unspecified: Secondary | ICD-10-CM | POA: Diagnosis not present

## 2023-09-24 DIAGNOSIS — I679 Cerebrovascular disease, unspecified: Secondary | ICD-10-CM | POA: Diagnosis not present

## 2023-09-24 DIAGNOSIS — Z9981 Dependence on supplemental oxygen: Secondary | ICD-10-CM | POA: Diagnosis not present

## 2023-09-24 DIAGNOSIS — Z87891 Personal history of nicotine dependence: Secondary | ICD-10-CM | POA: Diagnosis not present

## 2023-09-24 DIAGNOSIS — G4733 Obstructive sleep apnea (adult) (pediatric): Secondary | ICD-10-CM | POA: Diagnosis not present

## 2023-09-24 DIAGNOSIS — G8929 Other chronic pain: Secondary | ICD-10-CM | POA: Diagnosis not present

## 2023-09-24 DIAGNOSIS — G8194 Hemiplegia, unspecified affecting left nondominant side: Secondary | ICD-10-CM | POA: Diagnosis not present

## 2023-09-24 DIAGNOSIS — Z9682 Presence of neurostimulator: Secondary | ICD-10-CM | POA: Diagnosis not present

## 2023-09-24 DIAGNOSIS — Z7951 Long term (current) use of inhaled steroids: Secondary | ICD-10-CM | POA: Diagnosis not present

## 2023-09-24 DIAGNOSIS — J9611 Chronic respiratory failure with hypoxia: Secondary | ICD-10-CM | POA: Diagnosis not present

## 2023-09-24 DIAGNOSIS — E1140 Type 2 diabetes mellitus with diabetic neuropathy, unspecified: Secondary | ICD-10-CM | POA: Diagnosis not present

## 2023-09-24 DIAGNOSIS — I10 Essential (primary) hypertension: Secondary | ICD-10-CM | POA: Diagnosis not present

## 2023-09-24 DIAGNOSIS — J8410 Pulmonary fibrosis, unspecified: Secondary | ICD-10-CM | POA: Diagnosis not present

## 2023-09-24 DIAGNOSIS — I2510 Atherosclerotic heart disease of native coronary artery without angina pectoris: Secondary | ICD-10-CM | POA: Diagnosis not present

## 2023-09-24 DIAGNOSIS — K219 Gastro-esophageal reflux disease without esophagitis: Secondary | ICD-10-CM | POA: Diagnosis not present

## 2023-09-24 DIAGNOSIS — Z955 Presence of coronary angioplasty implant and graft: Secondary | ICD-10-CM | POA: Diagnosis not present

## 2023-09-24 DIAGNOSIS — J309 Allergic rhinitis, unspecified: Secondary | ICD-10-CM | POA: Diagnosis not present

## 2023-09-24 DIAGNOSIS — K589 Irritable bowel syndrome without diarrhea: Secondary | ICD-10-CM | POA: Diagnosis not present

## 2023-09-24 DIAGNOSIS — J4489 Other specified chronic obstructive pulmonary disease: Secondary | ICD-10-CM | POA: Diagnosis not present

## 2023-09-24 LAB — CYCLIC CITRUL PEPTIDE ANTIBODY, IGG: Cyclic Citrullin Peptide Ab: <16 U

## 2023-09-24 LAB — ANA: Anti Nuclear Antibody (ANA): NEGATIVE

## 2023-09-24 LAB — RHEUMATOID FACTOR: Rheumatoid fact SerPl-aCnc: <10 [IU]/mL (ref <14)

## 2023-09-24 LAB — ANGIOTENSIN CONVERTING ENZYME: Angiotensin-Converting Enzyme: 15 U/L (ref 9–67)

## 2023-09-24 NOTE — Telephone Encounter (Signed)
 I called and spoke with pt. Pt states she needs a rx for a Rollator. I informed pt that she would need to contact her PCP for this. Pt verbalized understanding. nfn

## 2023-09-24 NOTE — Telephone Encounter (Signed)
 Call PT w/Lab results. I adv some labs are in, some are not. Call her with what is in. Her # is [phone]  Also, Patient states needs order for rollator. Patient states getting thru [employer].

## 2023-09-24 NOTE — Telephone Encounter (Signed)
 I called and spoke with pt. Pt would like the blood work results that were ordered by [employer]. Please advise.

## 2023-09-24 NOTE — Telephone Encounter (Signed)
 According to epic labs are still processing will have to wait till they process  As soon as back we can let her know

## 2023-09-25 NOTE — Telephone Encounter (Signed)
 I called and spoke to pt. Pt was notified of Tammy's note. Pt verbalized understanding. NFN

## 2023-09-26 DIAGNOSIS — E1151 Type 2 diabetes mellitus with diabetic peripheral angiopathy without gangrene: Secondary | ICD-10-CM | POA: Diagnosis not present

## 2023-09-26 DIAGNOSIS — G8929 Other chronic pain: Secondary | ICD-10-CM | POA: Diagnosis not present

## 2023-09-26 DIAGNOSIS — Z7902 Long term (current) use of antithrombotics/antiplatelets: Secondary | ICD-10-CM | POA: Diagnosis not present

## 2023-09-26 DIAGNOSIS — J4489 Other specified chronic obstructive pulmonary disease: Secondary | ICD-10-CM | POA: Diagnosis not present

## 2023-09-26 DIAGNOSIS — I6529 Occlusion and stenosis of unspecified carotid artery: Secondary | ICD-10-CM | POA: Diagnosis not present

## 2023-09-26 DIAGNOSIS — G8194 Hemiplegia, unspecified affecting left nondominant side: Secondary | ICD-10-CM | POA: Diagnosis not present

## 2023-09-26 DIAGNOSIS — Z9682 Presence of neurostimulator: Secondary | ICD-10-CM | POA: Diagnosis not present

## 2023-09-26 DIAGNOSIS — J8410 Pulmonary fibrosis, unspecified: Secondary | ICD-10-CM | POA: Diagnosis not present

## 2023-09-26 DIAGNOSIS — I2510 Atherosclerotic heart disease of native coronary artery without angina pectoris: Secondary | ICD-10-CM | POA: Diagnosis not present

## 2023-09-26 DIAGNOSIS — Z7951 Long term (current) use of inhaled steroids: Secondary | ICD-10-CM | POA: Diagnosis not present

## 2023-09-26 DIAGNOSIS — Z87891 Personal history of nicotine dependence: Secondary | ICD-10-CM | POA: Diagnosis not present

## 2023-09-26 DIAGNOSIS — M545 Low back pain, unspecified: Secondary | ICD-10-CM | POA: Diagnosis not present

## 2023-09-26 DIAGNOSIS — J9611 Chronic respiratory failure with hypoxia: Secondary | ICD-10-CM | POA: Diagnosis not present

## 2023-09-26 DIAGNOSIS — K219 Gastro-esophageal reflux disease without esophagitis: Secondary | ICD-10-CM | POA: Diagnosis not present

## 2023-09-26 DIAGNOSIS — M797 Fibromyalgia: Secondary | ICD-10-CM | POA: Diagnosis not present

## 2023-09-26 DIAGNOSIS — Z7984 Long term (current) use of oral hypoglycemic drugs: Secondary | ICD-10-CM | POA: Diagnosis not present

## 2023-09-26 DIAGNOSIS — Z9981 Dependence on supplemental oxygen: Secondary | ICD-10-CM | POA: Diagnosis not present

## 2023-09-26 DIAGNOSIS — G4733 Obstructive sleep apnea (adult) (pediatric): Secondary | ICD-10-CM | POA: Diagnosis not present

## 2023-09-26 DIAGNOSIS — Z7982 Long term (current) use of aspirin: Secondary | ICD-10-CM | POA: Diagnosis not present

## 2023-09-26 DIAGNOSIS — Z955 Presence of coronary angioplasty implant and graft: Secondary | ICD-10-CM | POA: Diagnosis not present

## 2023-09-26 DIAGNOSIS — I10 Essential (primary) hypertension: Secondary | ICD-10-CM | POA: Diagnosis not present

## 2023-09-26 LAB — HYPERSENSITIVITY PNEUMONITIS
A. Pullulans Abs: NEGATIVE
A.Fumigatus #1 Abs: NEGATIVE
Micropolyspora faeni, IgG: NEGATIVE
Pigeon Serum Abs: NEGATIVE
Thermoact. Saccharii: NEGATIVE
Thermoactinomyces vulgaris, IgG: NEGATIVE

## 2023-09-29 DIAGNOSIS — M545 Low back pain, unspecified: Secondary | ICD-10-CM | POA: Diagnosis not present

## 2023-09-29 DIAGNOSIS — M797 Fibromyalgia: Secondary | ICD-10-CM | POA: Diagnosis not present

## 2023-09-29 DIAGNOSIS — Z7982 Long term (current) use of aspirin: Secondary | ICD-10-CM | POA: Diagnosis not present

## 2023-09-29 DIAGNOSIS — Z87891 Personal history of nicotine dependence: Secondary | ICD-10-CM | POA: Diagnosis not present

## 2023-09-29 DIAGNOSIS — I2510 Atherosclerotic heart disease of native coronary artery without angina pectoris: Secondary | ICD-10-CM | POA: Diagnosis not present

## 2023-09-29 DIAGNOSIS — Z7984 Long term (current) use of oral hypoglycemic drugs: Secondary | ICD-10-CM | POA: Diagnosis not present

## 2023-09-29 DIAGNOSIS — G4733 Obstructive sleep apnea (adult) (pediatric): Secondary | ICD-10-CM | POA: Diagnosis not present

## 2023-09-29 DIAGNOSIS — I10 Essential (primary) hypertension: Secondary | ICD-10-CM | POA: Diagnosis not present

## 2023-09-29 DIAGNOSIS — I6529 Occlusion and stenosis of unspecified carotid artery: Secondary | ICD-10-CM | POA: Diagnosis not present

## 2023-09-29 DIAGNOSIS — Z7902 Long term (current) use of antithrombotics/antiplatelets: Secondary | ICD-10-CM | POA: Diagnosis not present

## 2023-09-29 DIAGNOSIS — E1151 Type 2 diabetes mellitus with diabetic peripheral angiopathy without gangrene: Secondary | ICD-10-CM | POA: Diagnosis not present

## 2023-09-29 DIAGNOSIS — Z955 Presence of coronary angioplasty implant and graft: Secondary | ICD-10-CM | POA: Diagnosis not present

## 2023-09-29 DIAGNOSIS — J9611 Chronic respiratory failure with hypoxia: Secondary | ICD-10-CM | POA: Diagnosis not present

## 2023-09-29 DIAGNOSIS — G8929 Other chronic pain: Secondary | ICD-10-CM | POA: Diagnosis not present

## 2023-09-29 DIAGNOSIS — G8194 Hemiplegia, unspecified affecting left nondominant side: Secondary | ICD-10-CM | POA: Diagnosis not present

## 2023-09-29 DIAGNOSIS — Z9981 Dependence on supplemental oxygen: Secondary | ICD-10-CM | POA: Diagnosis not present

## 2023-09-29 DIAGNOSIS — Z7951 Long term (current) use of inhaled steroids: Secondary | ICD-10-CM | POA: Diagnosis not present

## 2023-09-29 DIAGNOSIS — J8410 Pulmonary fibrosis, unspecified: Secondary | ICD-10-CM | POA: Diagnosis not present

## 2023-09-29 DIAGNOSIS — Z9682 Presence of neurostimulator: Secondary | ICD-10-CM | POA: Diagnosis not present

## 2023-09-29 DIAGNOSIS — J4489 Other specified chronic obstructive pulmonary disease: Secondary | ICD-10-CM | POA: Diagnosis not present

## 2023-09-29 DIAGNOSIS — K219 Gastro-esophageal reflux disease without esophagitis: Secondary | ICD-10-CM | POA: Diagnosis not present

## 2023-09-30 ENCOUNTER — Other Ambulatory Visit: Payer: Self-pay | Admitting: Pulmonary Disease

## 2023-10-01 ENCOUNTER — Telehealth: Payer: Self-pay | Admitting: Adult Health

## 2023-10-01 NOTE — Telephone Encounter (Signed)
 PT calling again for BW results.

## 2023-10-01 NOTE — Telephone Encounter (Signed)
 PT calling again for blood work results. I adv PT they are in now and that we would have the nurse give her a call. Her # is [phone]

## 2023-10-01 NOTE — Telephone Encounter (Signed)
 Byrd, Lori T, CMA     09/24/23 11:55 AM Note I called and spoke with pt. Pt would like the blood work results that were ordered by [employer]. Please advise.       Julio Sicks, NP     09/24/23 11:58 AM Note According to epic labs are still processing will have to wait till they process  As soon as back we can let her know        09/24/23 11:55 AM Byrd, Lori T, CMA routed this conversation to Parrett, Virgel Bouquet, NP  This was address previously on 3/6 after the patient called on 3/5 regarding results.  I am sending a message to Lori Byrd regarding results now that they have finalized.    Lori Byrd, please advise regarding lab results.  Thank you.

## 2023-10-02 DIAGNOSIS — I679 Cerebrovascular disease, unspecified: Secondary | ICD-10-CM | POA: Diagnosis not present

## 2023-10-02 NOTE — Telephone Encounter (Signed)
 Patient checking on labwork results. Patient phone number is [phone].

## 2023-10-03 DIAGNOSIS — K219 Gastro-esophageal reflux disease without esophagitis: Secondary | ICD-10-CM | POA: Diagnosis not present

## 2023-10-03 DIAGNOSIS — I2510 Atherosclerotic heart disease of native coronary artery without angina pectoris: Secondary | ICD-10-CM | POA: Diagnosis not present

## 2023-10-03 DIAGNOSIS — J4489 Other specified chronic obstructive pulmonary disease: Secondary | ICD-10-CM | POA: Diagnosis not present

## 2023-10-03 DIAGNOSIS — E1151 Type 2 diabetes mellitus with diabetic peripheral angiopathy without gangrene: Secondary | ICD-10-CM | POA: Diagnosis not present

## 2023-10-03 DIAGNOSIS — Z7902 Long term (current) use of antithrombotics/antiplatelets: Secondary | ICD-10-CM | POA: Diagnosis not present

## 2023-10-03 DIAGNOSIS — J9611 Chronic respiratory failure with hypoxia: Secondary | ICD-10-CM | POA: Diagnosis not present

## 2023-10-03 DIAGNOSIS — Z7951 Long term (current) use of inhaled steroids: Secondary | ICD-10-CM | POA: Diagnosis not present

## 2023-10-03 DIAGNOSIS — Z955 Presence of coronary angioplasty implant and graft: Secondary | ICD-10-CM | POA: Diagnosis not present

## 2023-10-03 DIAGNOSIS — J8410 Pulmonary fibrosis, unspecified: Secondary | ICD-10-CM | POA: Diagnosis not present

## 2023-10-03 DIAGNOSIS — Z87891 Personal history of nicotine dependence: Secondary | ICD-10-CM | POA: Diagnosis not present

## 2023-10-03 DIAGNOSIS — Z7984 Long term (current) use of oral hypoglycemic drugs: Secondary | ICD-10-CM | POA: Diagnosis not present

## 2023-10-03 DIAGNOSIS — G4733 Obstructive sleep apnea (adult) (pediatric): Secondary | ICD-10-CM | POA: Diagnosis not present

## 2023-10-03 DIAGNOSIS — G8194 Hemiplegia, unspecified affecting left nondominant side: Secondary | ICD-10-CM | POA: Diagnosis not present

## 2023-10-03 DIAGNOSIS — I6529 Occlusion and stenosis of unspecified carotid artery: Secondary | ICD-10-CM | POA: Diagnosis not present

## 2023-10-03 DIAGNOSIS — Z7982 Long term (current) use of aspirin: Secondary | ICD-10-CM | POA: Diagnosis not present

## 2023-10-03 DIAGNOSIS — G8929 Other chronic pain: Secondary | ICD-10-CM | POA: Diagnosis not present

## 2023-10-03 DIAGNOSIS — M797 Fibromyalgia: Secondary | ICD-10-CM | POA: Diagnosis not present

## 2023-10-03 DIAGNOSIS — I10 Essential (primary) hypertension: Secondary | ICD-10-CM | POA: Diagnosis not present

## 2023-10-03 DIAGNOSIS — Z9981 Dependence on supplemental oxygen: Secondary | ICD-10-CM | POA: Diagnosis not present

## 2023-10-03 DIAGNOSIS — M545 Low back pain, unspecified: Secondary | ICD-10-CM | POA: Diagnosis not present

## 2023-10-03 DIAGNOSIS — Z9682 Presence of neurostimulator: Secondary | ICD-10-CM | POA: Diagnosis not present

## 2023-10-03 NOTE — Telephone Encounter (Signed)
 Called and spoke with patient, provided results per Rubye Oaks NP:   Ms. Lineman, wanted to let you know that your lab work looked okay.  We will discuss in full detail at your follow-up visit.    Sincerely Tammy Parrett NP   She verbalized understanding.  Nothing further needed.

## 2023-10-08 DIAGNOSIS — Z7902 Long term (current) use of antithrombotics/antiplatelets: Secondary | ICD-10-CM | POA: Diagnosis not present

## 2023-10-08 DIAGNOSIS — I10 Essential (primary) hypertension: Secondary | ICD-10-CM | POA: Diagnosis not present

## 2023-10-08 DIAGNOSIS — Z955 Presence of coronary angioplasty implant and graft: Secondary | ICD-10-CM | POA: Diagnosis not present

## 2023-10-08 DIAGNOSIS — J8410 Pulmonary fibrosis, unspecified: Secondary | ICD-10-CM | POA: Diagnosis not present

## 2023-10-08 DIAGNOSIS — G8194 Hemiplegia, unspecified affecting left nondominant side: Secondary | ICD-10-CM | POA: Diagnosis not present

## 2023-10-08 DIAGNOSIS — G8929 Other chronic pain: Secondary | ICD-10-CM | POA: Diagnosis not present

## 2023-10-08 DIAGNOSIS — Z7984 Long term (current) use of oral hypoglycemic drugs: Secondary | ICD-10-CM | POA: Diagnosis not present

## 2023-10-08 DIAGNOSIS — M545 Low back pain, unspecified: Secondary | ICD-10-CM | POA: Diagnosis not present

## 2023-10-08 DIAGNOSIS — I2510 Atherosclerotic heart disease of native coronary artery without angina pectoris: Secondary | ICD-10-CM | POA: Diagnosis not present

## 2023-10-08 DIAGNOSIS — K219 Gastro-esophageal reflux disease without esophagitis: Secondary | ICD-10-CM | POA: Diagnosis not present

## 2023-10-08 DIAGNOSIS — Z7982 Long term (current) use of aspirin: Secondary | ICD-10-CM | POA: Diagnosis not present

## 2023-10-08 DIAGNOSIS — Z7951 Long term (current) use of inhaled steroids: Secondary | ICD-10-CM | POA: Diagnosis not present

## 2023-10-08 DIAGNOSIS — Z9981 Dependence on supplemental oxygen: Secondary | ICD-10-CM | POA: Diagnosis not present

## 2023-10-08 DIAGNOSIS — J4489 Other specified chronic obstructive pulmonary disease: Secondary | ICD-10-CM | POA: Diagnosis not present

## 2023-10-08 DIAGNOSIS — J9611 Chronic respiratory failure with hypoxia: Secondary | ICD-10-CM | POA: Diagnosis not present

## 2023-10-08 DIAGNOSIS — G4733 Obstructive sleep apnea (adult) (pediatric): Secondary | ICD-10-CM | POA: Diagnosis not present

## 2023-10-08 DIAGNOSIS — Z9682 Presence of neurostimulator: Secondary | ICD-10-CM | POA: Diagnosis not present

## 2023-10-08 DIAGNOSIS — E1151 Type 2 diabetes mellitus with diabetic peripheral angiopathy without gangrene: Secondary | ICD-10-CM | POA: Diagnosis not present

## 2023-10-08 DIAGNOSIS — Z87891 Personal history of nicotine dependence: Secondary | ICD-10-CM | POA: Diagnosis not present

## 2023-10-08 DIAGNOSIS — M797 Fibromyalgia: Secondary | ICD-10-CM | POA: Diagnosis not present

## 2023-10-08 DIAGNOSIS — I6529 Occlusion and stenosis of unspecified carotid artery: Secondary | ICD-10-CM | POA: Diagnosis not present

## 2023-10-10 DIAGNOSIS — I6529 Occlusion and stenosis of unspecified carotid artery: Secondary | ICD-10-CM | POA: Diagnosis not present

## 2023-10-10 DIAGNOSIS — Z9981 Dependence on supplemental oxygen: Secondary | ICD-10-CM | POA: Diagnosis not present

## 2023-10-10 DIAGNOSIS — Z7951 Long term (current) use of inhaled steroids: Secondary | ICD-10-CM | POA: Diagnosis not present

## 2023-10-10 DIAGNOSIS — M545 Low back pain, unspecified: Secondary | ICD-10-CM | POA: Diagnosis not present

## 2023-10-10 DIAGNOSIS — Z7984 Long term (current) use of oral hypoglycemic drugs: Secondary | ICD-10-CM | POA: Diagnosis not present

## 2023-10-10 DIAGNOSIS — J4489 Other specified chronic obstructive pulmonary disease: Secondary | ICD-10-CM | POA: Diagnosis not present

## 2023-10-10 DIAGNOSIS — Z955 Presence of coronary angioplasty implant and graft: Secondary | ICD-10-CM | POA: Diagnosis not present

## 2023-10-10 DIAGNOSIS — I10 Essential (primary) hypertension: Secondary | ICD-10-CM | POA: Diagnosis not present

## 2023-10-10 DIAGNOSIS — G8194 Hemiplegia, unspecified affecting left nondominant side: Secondary | ICD-10-CM | POA: Diagnosis not present

## 2023-10-10 DIAGNOSIS — E1151 Type 2 diabetes mellitus with diabetic peripheral angiopathy without gangrene: Secondary | ICD-10-CM | POA: Diagnosis not present

## 2023-10-10 DIAGNOSIS — G8929 Other chronic pain: Secondary | ICD-10-CM | POA: Diagnosis not present

## 2023-10-10 DIAGNOSIS — Z9682 Presence of neurostimulator: Secondary | ICD-10-CM | POA: Diagnosis not present

## 2023-10-10 DIAGNOSIS — J8410 Pulmonary fibrosis, unspecified: Secondary | ICD-10-CM | POA: Diagnosis not present

## 2023-10-10 DIAGNOSIS — J9611 Chronic respiratory failure with hypoxia: Secondary | ICD-10-CM | POA: Diagnosis not present

## 2023-10-10 DIAGNOSIS — M797 Fibromyalgia: Secondary | ICD-10-CM | POA: Diagnosis not present

## 2023-10-10 DIAGNOSIS — I2510 Atherosclerotic heart disease of native coronary artery without angina pectoris: Secondary | ICD-10-CM | POA: Diagnosis not present

## 2023-10-10 DIAGNOSIS — G4733 Obstructive sleep apnea (adult) (pediatric): Secondary | ICD-10-CM | POA: Diagnosis not present

## 2023-10-10 DIAGNOSIS — K219 Gastro-esophageal reflux disease without esophagitis: Secondary | ICD-10-CM | POA: Diagnosis not present

## 2023-10-10 DIAGNOSIS — Z87891 Personal history of nicotine dependence: Secondary | ICD-10-CM | POA: Diagnosis not present

## 2023-10-10 DIAGNOSIS — Z7902 Long term (current) use of antithrombotics/antiplatelets: Secondary | ICD-10-CM | POA: Diagnosis not present

## 2023-10-10 DIAGNOSIS — Z7982 Long term (current) use of aspirin: Secondary | ICD-10-CM | POA: Diagnosis not present

## 2023-10-13 DIAGNOSIS — I771 Stricture of artery: Secondary | ICD-10-CM | POA: Diagnosis not present

## 2023-10-13 DIAGNOSIS — K219 Gastro-esophageal reflux disease without esophagitis: Secondary | ICD-10-CM | POA: Diagnosis not present

## 2023-10-13 DIAGNOSIS — E1141 Type 2 diabetes mellitus with diabetic mononeuropathy: Secondary | ICD-10-CM | POA: Diagnosis not present

## 2023-10-13 DIAGNOSIS — E119 Type 2 diabetes mellitus without complications: Secondary | ICD-10-CM | POA: Diagnosis not present

## 2023-10-13 DIAGNOSIS — I739 Peripheral vascular disease, unspecified: Secondary | ICD-10-CM | POA: Diagnosis not present

## 2023-10-13 DIAGNOSIS — I6523 Occlusion and stenosis of bilateral carotid arteries: Secondary | ICD-10-CM | POA: Diagnosis not present

## 2023-10-13 DIAGNOSIS — E1142 Type 2 diabetes mellitus with diabetic polyneuropathy: Secondary | ICD-10-CM | POA: Diagnosis not present

## 2023-10-13 DIAGNOSIS — J449 Chronic obstructive pulmonary disease, unspecified: Secondary | ICD-10-CM | POA: Diagnosis not present

## 2023-10-13 DIAGNOSIS — G4733 Obstructive sleep apnea (adult) (pediatric): Secondary | ICD-10-CM | POA: Diagnosis not present

## 2023-10-13 DIAGNOSIS — Z7902 Long term (current) use of antithrombotics/antiplatelets: Secondary | ICD-10-CM | POA: Diagnosis not present

## 2023-10-13 DIAGNOSIS — I10 Essential (primary) hypertension: Secondary | ICD-10-CM | POA: Diagnosis not present

## 2023-10-15 DIAGNOSIS — Z9981 Dependence on supplemental oxygen: Secondary | ICD-10-CM | POA: Diagnosis not present

## 2023-10-15 DIAGNOSIS — G8194 Hemiplegia, unspecified affecting left nondominant side: Secondary | ICD-10-CM | POA: Diagnosis not present

## 2023-10-15 DIAGNOSIS — J4489 Other specified chronic obstructive pulmonary disease: Secondary | ICD-10-CM | POA: Diagnosis not present

## 2023-10-15 DIAGNOSIS — Z7982 Long term (current) use of aspirin: Secondary | ICD-10-CM | POA: Diagnosis not present

## 2023-10-15 DIAGNOSIS — E1151 Type 2 diabetes mellitus with diabetic peripheral angiopathy without gangrene: Secondary | ICD-10-CM | POA: Diagnosis not present

## 2023-10-15 DIAGNOSIS — M545 Low back pain, unspecified: Secondary | ICD-10-CM | POA: Diagnosis not present

## 2023-10-15 DIAGNOSIS — G4733 Obstructive sleep apnea (adult) (pediatric): Secondary | ICD-10-CM | POA: Diagnosis not present

## 2023-10-15 DIAGNOSIS — I2510 Atherosclerotic heart disease of native coronary artery without angina pectoris: Secondary | ICD-10-CM | POA: Diagnosis not present

## 2023-10-15 DIAGNOSIS — K219 Gastro-esophageal reflux disease without esophagitis: Secondary | ICD-10-CM | POA: Diagnosis not present

## 2023-10-15 DIAGNOSIS — J9611 Chronic respiratory failure with hypoxia: Secondary | ICD-10-CM | POA: Diagnosis not present

## 2023-10-15 DIAGNOSIS — I6529 Occlusion and stenosis of unspecified carotid artery: Secondary | ICD-10-CM | POA: Diagnosis not present

## 2023-10-15 DIAGNOSIS — Z87891 Personal history of nicotine dependence: Secondary | ICD-10-CM | POA: Diagnosis not present

## 2023-10-15 DIAGNOSIS — Z9682 Presence of neurostimulator: Secondary | ICD-10-CM | POA: Diagnosis not present

## 2023-10-15 DIAGNOSIS — I10 Essential (primary) hypertension: Secondary | ICD-10-CM | POA: Diagnosis not present

## 2023-10-15 DIAGNOSIS — Z7984 Long term (current) use of oral hypoglycemic drugs: Secondary | ICD-10-CM | POA: Diagnosis not present

## 2023-10-15 DIAGNOSIS — G8929 Other chronic pain: Secondary | ICD-10-CM | POA: Diagnosis not present

## 2023-10-15 DIAGNOSIS — Z955 Presence of coronary angioplasty implant and graft: Secondary | ICD-10-CM | POA: Diagnosis not present

## 2023-10-15 DIAGNOSIS — Z7951 Long term (current) use of inhaled steroids: Secondary | ICD-10-CM | POA: Diagnosis not present

## 2023-10-15 DIAGNOSIS — M797 Fibromyalgia: Secondary | ICD-10-CM | POA: Diagnosis not present

## 2023-10-15 DIAGNOSIS — Z7902 Long term (current) use of antithrombotics/antiplatelets: Secondary | ICD-10-CM | POA: Diagnosis not present

## 2023-10-15 DIAGNOSIS — J8410 Pulmonary fibrosis, unspecified: Secondary | ICD-10-CM | POA: Diagnosis not present

## 2023-11-04 ENCOUNTER — Ambulatory Visit: Payer: Self-pay | Admitting: Pulmonary Disease

## 2023-11-04 NOTE — Telephone Encounter (Signed)
 Sarah, please advise.   Dr. Waylan Haggard is unavailable.

## 2023-11-04 NOTE — Telephone Encounter (Signed)
 Spoke to patient and relayed below message/recommendations. She stated that she is on her way to see PCP now.

## 2023-11-04 NOTE — Telephone Encounter (Signed)
 Copied from CRM [id number]. Topic: Clinical - Red Word Triage >> Nov 04, 2023  8:41 AM Justina Oman C wrote: Red Word that prompted transfer to Nurse Triage: Patient [phone] states feeling awful, sweating at night, hurt on back and left side flank, shortness of breath, dizziness, and feeling weak. Patient has an appointment for 12/09/23 and needs to be seen sooner. Please advise.  TRIAGE SUMMARY NOTE: Pt reporting that yesterday she had several symptoms come on all at once after exposure to pollen while shopping in grocery store, including "a little" more SOB than normal, intermittent dizziness, weakness, "feeling awful," pt also sweating at night so poor sleep, body aches, headache, coughing up clear sputum, back/flank pain, needing her oxygen therapy more frequently. Pt is speaking in full sentences and is "really scared" about potentially being sick or otherwise. Pt confirms no chest pain, nausea, fever, urinary symptoms, confirms she has not felt the need to use her rescue inhaler but has been 92% pulse ox today with HR in 100s-110s on 2L oxygen. Pt reporting that she has not had a BM in 4 days when she is usually very regular. Advised pt be examined in ED with sudden onset of these symptoms and her hx in mind. Pt stating she will not go to ED at [facility] and refusing ED in general at this time, preferring to see pulm, kidney doc, or her PCP. Advised that pt could call other docs as well but recommend ED at this time and have another adult drive her. Advised that sending HP message to pulm office for call back with further recommendations, advised ED for any worsening. Pt verbalized understanding and states she is going to alert her son about symptoms so he can potentially give her a ride somewhere. Please advise as appropriate.  E2C2 Pulmonary Triage - Initial Assessment Questions "Chief Complaint (e.g., cough, sob, wheezing, fever, chills, sweat or additional symptoms) *Go to specific symptom protocol  after initial questions. Went to grocery store yesterday started feeling so weak, thought taking sick so started taking mucinex Really scared Feeling awful Sweating at night Dizziness comes and goes, uses her walker, get SOB and put on oxygen, not feeling like going to pass out, I stagger BP 119/70, HR 111, sugar 140, 92%, pulse 104 Whole body is sore I guess from not sleeping good Back and flank pain, no injury, thought might be gas Headache, feel ruddy, think it's the pollen Speaking in full sentences SOB a little bit more than normal, even at rest Coughing up phlegm, just clear No chest pain No pain with urination or blood in the urine No fever, chills, nausea Prescribed ozempic, scared of ozempic so many side effects, not started it yet No BM in 4 days, wasn't much then Won't go to [facility], won't go to ED  "How long have symptoms been present?" Started yesterday  MEDICINES:   "Have you used any OTC meds to help with symptoms?" Yes If yes, ask "What medications?" mucinex  "Have you used your inhalers/maintenance medication?" No If yes, "What medications?" Not felt the need to use SOB at this point  OXYGEN: "Do you wear supplemental oxygen?" Yes If yes, "How many liters are you supposed to use?" 2L right now intermittent  "Do you monitor your oxygen levels?" Yes If yes, "What is your reading (oxygen level) today?" 92% with 104 bpm  "What is your usual oxygen saturation reading?"  (Note: Pulmonary O2 sats should be 90% or greater) 91-96%  Reason for Disposition  Patient sounds  very sick or weak to the triager  Answer Assessment - Initial Assessment Questions 2. SEVERITY: "How bad is it?"  "Can you stand and walk?"   - MILD (0-3): Feels weak or tired, but does not interfere with work, school or normal activities.   - MODERATE (4-7): Able to stand and walk; weakness interferes with work, school, or normal activities.   - SEVERE (8-10): Unable to stand or walk;  unable to do usual activities.     Don't do that much, able to shower and go to bathroom 4. CAUSE: "What do you think is causing the weakness or fatigue?" (e.g., not drinking enough fluids, medical problem, trouble sleeping)     Drink lots of water, trouble sleeping because sweating so bad 5. NEW MEDICINES:  "Have you started on any new medicines recently?" (e.g., opioid pain medicines, benzodiazepines, muscle relaxants, antidepressants, antihistamines, neuroleptics, beta blockers)     denies 6. OTHER SYMPTOMS: "Do you have any other symptoms?" (e.g., chest pain, fever, cough, SOB, vomiting, diarrhea, bleeding, other areas of pain)     denies  Protocols used: Weakness (Generalized) and Fatigue-A-AH

## 2023-11-05 ENCOUNTER — Ambulatory Visit: Payer: Self-pay | Admitting: Adult Health

## 2023-11-05 NOTE — Telephone Encounter (Signed)
 Chief Complaint: Breathing difficulty since Sunday night  Symptoms: Pain in chest (7/10), SOB, "hurts to breathe," chills, clammy/sweaty at night Pertinent Negatives: Patient denies fever  Disposition: [x] ED   Additional Notes: Spoke with pt's daughter, Lovett Ruck. Pt was seen by PCP yesterday and heard some wheezing in her chest. PCP took x-ray and pt was confirmed to have pneumonia. Pt has pulmonary fibrosis. Pt is on 2L of O2, 98% O2 saturation. This RN recommended ED based off CP and pain with inhalation. Pt daughter states understanding and is agreeable to plan.    Copied from CRM [id number]. Topic: Clinical - Red Word Triage >> Nov 05, 2023  4:43 PM Eveleen Hinds B wrote: [facility] that prompted transfer to Nurse Triage: [facility] Breathing Reason for Disposition  [1] MODERATE difficulty breathing (e.g., speaks in phrases, SOB even at rest, pulse 100-120) AND [2] NEW-onset or WORSE than normal  Answer Assessment - Initial Assessment Questions Chief Complaint: Breathing difficulty  Started Sunday night  Symptoms: Pain in chest (7/10), SOB, "hurts to breathe," chills, clammy/sweaty at night  Pertinent Negatives: Patient denies fever  On 2 L, O2 sat 98%  Protocols used: Breathing Difficulty-A-AH

## 2023-11-06 NOTE — Telephone Encounter (Signed)
 Will route to Dr. Alva  for [facility] .  Have OV next month .

## 2023-11-06 NOTE — Telephone Encounter (Signed)
 Tammy,  Dr. Villa Greaser is off, this is an FYI, pt was advised to go to ED.  She lives in [place], I assume she went to [place], she is not in the [employer].

## 2023-11-10 NOTE — Telephone Encounter (Signed)
 Spoke with patient regarding prior message . Per Dr.Alva wanted patient to be seen sooner made patient a office visit with Katie on 11/11/2022. Patient stated she has been using her Oxygen  and and was given antibiotics from PCP. Patient's voice was understanding . Nothing else further needed.

## 2023-11-11 ENCOUNTER — Encounter: Payer: Self-pay | Admitting: Nurse Practitioner

## 2023-11-11 ENCOUNTER — Ambulatory Visit (INDEPENDENT_AMBULATORY_CARE_PROVIDER_SITE_OTHER): Admitting: Nurse Practitioner

## 2023-11-11 VITALS — BP 110/70 | HR 86 | Ht 61.0 in | Wt 182.8 lb

## 2023-11-11 DIAGNOSIS — J189 Pneumonia, unspecified organism: Secondary | ICD-10-CM

## 2023-11-11 DIAGNOSIS — J849 Interstitial pulmonary disease, unspecified: Secondary | ICD-10-CM | POA: Diagnosis not present

## 2023-11-11 DIAGNOSIS — J9611 Chronic respiratory failure with hypoxia: Secondary | ICD-10-CM

## 2023-11-11 HISTORY — DX: Pneumonia, unspecified organism: J18.9

## 2023-11-11 NOTE — Assessment & Plan Note (Signed)
 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 Mild progression since 2005. See above. Awaiting repeat PFT. Prior PFT without formal restriction or obstruction. She did have a moderate diffusion defect. Will plan timing of next HRCT chest based on PFTs and clinical s/s of progression. Overall today improved.

## 2023-11-11 NOTE — Assessment & Plan Note (Addendum)
 Stable without increased O2 requirements. She was able to maintain saturations with minimal exertion and at rest in office today. Encouraged her to continue utilizing with activity and at night for goal >88-90%. Need to reach out to [employer] to troubleshoot humidity.

## 2023-11-11 NOTE — Patient Instructions (Signed)
 Wear Oxygen  2 L with activity and at bedtime, keep O2 sats >88-90%.  AYR saline nasal gel Twice daily  .  Albuterol  inhaler 1-2 puffs every 4-6hr as needed.  Activity as tolerated Continue on inspire at bedtime, keep follow up with Neurology.   Complete levaquin course  Keep an eye on your symptoms and let us  know if you start feeling worse or do not continue to improve  Chest x ray when you return   Follow up with [employer] on the humidity for your oxygen   Follow up with Dr. Villa Greaser 5/20 after lung function testing as scheduled. Please contact office for sooner follow up if symptoms do not improve or worsen or seek emergency care

## 2023-11-11 NOTE — Assessment & Plan Note (Signed)
 Reported pneumonia diagnosis by PCP. Imaging not available. Completing levaquin course. Clinically improved. Will plan to repeat her CXR at follow up in 4 weeks. Advised her to notify of any worsening symptoms. ED precautions reviewed.   Patient Instructions  Wear Oxygen  2 L with activity and at bedtime, keep O2 sats >88-90%.  AYR saline nasal gel Twice daily  .  Albuterol  inhaler 1-2 puffs every 4-6hr as needed.  Activity as tolerated Continue on inspire at bedtime, keep follow up with Neurology.   Complete levaquin course  Keep an eye on your symptoms and let us  know if you start feeling worse or do not continue to improve  Chest x ray when you return   Follow up with [employer] on the humidity for your oxygen   Follow up with Dr. Villa Greaser 5/20 after lung function testing as scheduled. Please contact office for sooner follow up if symptoms do not improve or worsen or seek emergency care

## 2023-11-11 NOTE — Progress Notes (Signed)
 @Patient  ID: Lori Byrd, female    DOB: 10-27-1950, 73 y.o.   MRN: [id number]  Chief Complaint  Patient presents with   Follow-up    History pneumonia on Monday. Pt states she feels better today.    Referring provider: Sherial D[place], *  HPI: 73 year old female, former smoker followed for ILD and chronic respiratory failure. She is a patient of Dr. Hortense Lyons and last seen in office by French Jester, NP. Past medical history significant for CAD, DM, carotid artery stenosis, HTN, OSA - treated with Inspire, GERD, IBS, DM, HLD, anxiety, heart murmur, claudication, depression, fibromyalgia, obesity, RLS.  TEST/EVENTS:  01/18/2019 HST: AHI 53/h 11/26/2021 PFT: FEV1 96%, FVC 87%, ratio 84, DLCO 67%. No BD. ONO significant desaturations >4 hr 08/14/2022 HRCT chest: upper lobe predominant subpleural reticular opacities with traction btx and honeycombing changes. Slow progression. Not UIP. 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 Stable 5 mm RLL nodule.  09/2023 serologies negative, HP negative   09/22/2023: OV with [facility] NP. No hx of amiodarone, macrodantin use. No hx of autoimmune disease. Has 1 dog. No birds/chickens.   Hopsitalized at [facility] recently. Having balance issues with falls and memory changes. Diagnosed with CVA. Discharged with PT/OT to start this week. Started on aspirin and statin. Following with neurology. She is also followed by neurology for OSA on Inspire. Prior walk test with exertional hypoxia 09/2021. Doing better since d/c. Starting PT and OT. 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 Using a walker. 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 Check autoimmune/CTD serology. ACE level. PFT upon return.   11/11/2023: Today - acute Patient presents today with long-term family friend.  Her daughter is also on the phone during the visit.  She had called in last week on 4/15 with reports of increased shortness of breath,  body aches, intermittent dizziness, weakness, productive cough with clear phlegm.  She was advised to go to the emergency room for further evaluation but she ended up being able to get in with her PCP who ordered a chest x-ray.  She was told that she had pneumonia.  The report and imaging is not available today.  They put her on Levaquin for 7 days.  She has 1 dose left. Feeling much better today.  Seems to be getting back to her baseline.  Still feels a little more short winded than she did prior to getting sick but overall cough is resolving and she is not having any more issues with body aches or night sweats.  Has not noticed any low oxygen  levels in the last couple days.  Utilizes her oxygen  occasionally with activity and then wears it at night.  She did use her rescue inhaler a couple times when she was sick but has not had to use it since.  Denies any fevers, chills, hemoptysis.  She does feel like her nose gets dried out with her oxygen .  She had called about adding humidity to her oxygen  but when they out of the bottle on, it just dumped water  into her nasal cannula.  She did call [employer].  She did not feel like was very helpful.  They said they do not typically send someone out except for every 2 weeks.  Did not offer to send out to troubleshoot the current problem.  She has not called them back.  She just took the humidity off the oxygen .   Allergies  Allergen Reactions   Penicillins Anaphylaxis  Did it involve swelling of the face/tongue/throat, SOB, or low BP? Yes Did it involve sudden or severe rash/hives, skin peeling, or any reaction on the inside of your mouth or nose? Yes Did you need to seek medical attention at a hospital or doctor's office? Yes When did it last happen? childhood      If all above answers are "NO", may proceed with cephalosporin use.    Asa [Aspirin] Other (See Comments)    Per Dr not to take; on Plavix    Toradol  [Ketorolac  Tromethamine ] Other (See Comments)     Told by MD not to take; on Plavix     Morphine  And Codeine Other (See Comments)    Pt preference    Tramadol Other (See Comments)    Pt does not like how this makes her feel    Immunization History  Administered Date(s) Administered   Fluad Quad(high Dose 65+) 05/21/2021   Influenza, High Dose Seasonal PF 05/05/2019, 05/31/2019   PFIZER(Purple Top)SARS-COV-2 Vaccination 10/15/2019, 11/12/2019   Pneumococcal Conjugate-13 04/19/2015, 05/26/2017   Tdap 04/19/2015   Zoster Recombinant(Shingrix) 04/19/2018, 08/22/2018   Zoster, Live 11/18/2012    Past Medical History:  Diagnosis Date   Acquired thrombophilia (HCC) 05/30/2021   Anemia 07/08/2015   Anxiety    Arteriosclerosis of both carotid arteries 07/08/2015   Arthritis    B12 deficiency    Bilateral bunions 07/08/2015   Bilateral carotid artery stenosis 08/11/2018   BMI 36.0-36.9,adult    Cancer (HCC)    skin cancer   Cardiac murmur 03/12/2022   Carotid artery stenosis    Chronic intermittent hypoxia with obstructive sleep apnea 06/22/2020   Chronic low back pain 07/08/2015   Chronic respiratory failure with hypoxia (HCC) 10/09/2021   Claudication (HCC)    COPD (chronic obstructive pulmonary disease) (HCC)    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/10/2018   Corn or callus 08/28/2017   Coronary arteriosclerosis 05/30/2021   Coronary artery disease    COVID-19 virus infection 11/21/2018   CPAP/BiPAP dependence 07/08/2015   Degenerative lumbar spinal stenosis 03/02/2020   Depression    Diabetes mellitus without complication (HCC)    Diabetic polyneuropathy associated with type 2 diabetes mellitus (HCC) 11/10/2018   DOE (dyspnea on exertion) 03/12/2022   Fibromyalgia    Fibromyalgia    GERD (gastroesophageal reflux disease)    Heart disease    Hyperlipidemia associated with type 2 diabetes mellitus (HCC)    Hypertension    IBS (irritable bowel syndrome)    Innominate artery  stenosis (HCC) 07/08/2015   Formatting of this note might be different from the original. Open stent in 11/2012 and restent in 07/2013   Intolerance of continuous positive airway pressure (CPAP) ventilation 11/10/2018   Lumbar radiculopathy 12/29/2020   Lumbar spondylosis 01/25/2021   Macular degeneration    Neuropathic pain of both feet 07/08/2015   Nodule of right lung 07/08/2015   Obesity 07/08/2015   Obesity (BMI 35.0-39.9 without comorbidity) 03/12/2022   Obstructive sleep apnea 02/19/2019   Osteopenia 12/08/2018   PAD (peripheral artery disease) (HCC) 11/10/2018   Peripheral vascular angioplasty status with implants and grafts 07/08/2015   Peripheral vascular disease (HCC) 03/02/2020   Primary fibromyalgia syndrome 03/02/2020   Recurrent insomnia 08/13/2019   Restless legs syndrome (RLS) 02/21/2016   Spondylolisthesis, grade 1 06/19/2020   Statin intolerance    Status post arterial stent 11/10/2018   Status post insertion of hypoglossal nerve stimulator 09/11/2021   Status post insertion of nerve  stimulator 06/22/2020   Type 2 diabetes mellitus with diabetic mononeuropathy, without long-term current use of insulin  (HCC) 12/05/2015   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 [facility] 11/11/2023   Counseling given: Not Answered Tobacco comments: Quit 29 years ago. [facility] 11/11/2023   Outpatient Medications Prior to Visit  Medication Sig Dispense Refill   albuterol  (VENTOLIN  HFA) 108 (90 Base) MCG/ACT inhaler INHALE 1-2 PUFFS INTO THE LUNGS EVERY 6 HOURS AS NEEDED. 18 each 5   amLODipine  (NORVASC ) 10 MG tablet Take 10 mg by mouth daily.     atorvastatin (LIPITOR) 40 MG tablet Take 40 mg by  mouth.     clopidogrel  (PLAVIX ) 75 MG tablet Take 1 tablet (75 mg total) by mouth daily. Restart plavix  on 07/03/19 30 tablet 12   Coenzyme Q10 (COQ10) 100 MG CAPS Take 100 mg by mouth daily.     diclofenac Sodium (VOLTAREN) 1 % GEL Apply 1 Application topically as needed for pain.     estradiol (ESTRACE) 0.1 MG/GM vaginal cream Place 1 Applicatorful vaginally once a week.     ezetimibe  (ZETIA ) 10 MG tablet Take 10 mg by mouth daily.     FARXIGA 5 MG TABS tablet Take 5 mg by mouth every morning.     fluticasone  (FLONASE ) 50 MCG/ACT nasal spray Place 1 spray into both nostrils daily.     gabapentin (NEURONTIN) 100 MG capsule Take 200 mg by mouth at bedtime.     meclizine (ANTIVERT) 25 MG tablet Take 25-50 mg by mouth 2 (two) times daily.     metoprolol succinate (TOPROL-XL) 100 MG 24 hr tablet Take 100 mg by mouth daily.     nitroGLYCERIN  (NITROSTAT ) 0.4 MG SL tablet Place 0.4 mg under the tongue every 5 (five) minutes as needed for chest pain.     OZEMPIC, 0.25 OR 0.5 MG/DOSE, 2 MG/3ML SOPN Inject 0.25 mg into the skin once a week.     pantoprazole  (PROTONIX ) 40 MG tablet Take 40 mg by mouth every morning.      RESTASIS  0.05 % ophthalmic emulsion Place 1 drop into both eyes 2 (two) times daily.     venlafaxine  XR (EFFEXOR -XR) 75 MG 24 hr capsule Take 75 mg by mouth at bedtime.      No facility-administered medications prior to visit.     Review of Systems:   Constitutional: No weight loss or gain, night sweats, fevers, chills, or lassitude. +fatigue (baseline) HEENT: No headaches, difficulty swallowing, tooth/dental problems, or sore throat. No sneezing, itching, ear ache, nasal congestion, or post nasal drip CV:  No chest pain, orthopnea, PND, swelling in lower extremities, anasarca, dizziness, palpitations, syncope Resp: + shortness of breath with exertion; minimal cough. No excess mucus or change in color of mucus. No hemoptysis. No wheezing.  No chest wall deformity GI:  No heartburn,  indigestion, abdominal pain, nausea, vomiting, diarrhea, change in bowel habits, loss of appetite, bloody stools.  GU: No dysuria, change in color of urine, urgency or frequency Skin: No rash, lesions, ulcerations MSK:  No joint pain or swelling.   Neuro: No memory impairment. +assistive device for ambulation  Psych: No depression or anxiety. Mood stable.     Physical Exam:  BP 110/70 (BP Location: Left Arm, Patient Position: Sitting, Cuff Size: Normal)   Pulse 86   Ht 5\' 1"  (1.549 m)   Wt 182 lb 12.8 oz (82.9 kg)   SpO2 94%   BMI 34.54 kg/m   GEN: Pleasant, interactive, well-kempt; in no acute distress HEENT:  Normocephalic and atraumatic. PERRLA. Sclera white. Nasal turbinates pink, moist and patent bilaterally. No rhinorrhea present. Oropharynx pink and moist, without exudate or edema. No lesions, ulcerations, or postnasal drip.  NECK:  Supple w/ fair ROM. No JVD present. Normal carotid impulses w/o bruits. Thyroid  symmetrical with no goiter or nodules palpated. No lymphadenopathy.   CV: RRR, no m/r/g, no peripheral edema. Pulses intact, +2 bilaterally. No cyanosis, pallor or clubbing. PULMONARY:  Unlabored, regular breathing. Clear bilaterally A&P w/o wheezes/rales/rhonchi. No accessory muscle use.  GI: BS present and normoactive. Soft, non-tender to palpation. No organomegaly or masses detected.  MSK: No erythema, warmth or tenderness. Cap refil <2 sec all extrem. No deformities or joint swelling noted.  Neuro: A/Ox3. No focal deficits noted.   Skin: Warm, no lesions or rashe Psych: Normal affect and behavior. Judgement and thought content appropriate.     Lab Results:  CBC    Component Value Date/Time   WBC 4.5 10/09/2021 1157   RBC 4.63 10/09/2021 1157   HGB 13.2 10/09/2021 1157   HCT 40.5 10/09/2021 1157   PLT 269.0 10/09/2021 1157   MCV 87.5 10/09/2021 1157   MCH 29.2 06/28/2019 0820   MCHC 32.7 10/09/2021 1157   RDW 14.9 10/09/2021 1157   LYMPHSABS 1.6  10/09/2021 1157   MONOABS 0.4 10/09/2021 1157   EOSABS 0.2 10/09/2021 1157   BASOSABS 0.0 10/09/2021 1157    BMET    Component Value Date/Time   NA 137 10/09/2021 1157   K 4.1 10/09/2021 1157   CL 97 10/09/2021 1157   CO2 30 10/09/2021 1157   GLUCOSE 103 (H) 10/09/2021 1157   BUN 16 10/09/2021 1157   CREATININE 0.99 10/09/2021 1157   CALCIUM 10.0 10/09/2021 1157   GFRNONAA >60 06/28/2019 0820   GFRAA >60 06/28/2019 0820    BNP    Component Value Date/Time   BNP 18.7 11/22/2018 0830     Imaging:  No results found.  Administration History     None          Latest Ref Rng & Units 11/26/2021    1:10 PM  PFT Results  FVC-Pre L 2.31   FVC-Predicted Pre % 87   FVC-Post L 2.30   FVC-Predicted Post % 87   Pre FEV1/FVC % % 82   Post FEV1/FCV % % 84   FEV1-Pre L 1.90   FEV1-Predicted Pre % 95   FEV1-Post L 1.92   DLCO uncorrected ml/min/mmHg 11.80   DLCO UNC% % 67   DLCO corrected ml/min/mmHg 11.87   DLCO COR %Predicted % 67   DLVA Predicted % 83   TLC L 4.02   TLC % Predicted % 87   RV % Predicted % 81     No results found for: "NITRICOXIDE"      Assessment & Plan:   CAP (community acquired pneumonia) Reported pneumonia diagnosis by PCP. Imaging not available. Completing levaquin course. Clinically improved. Will plan to repeat her CXR at follow up in 4 weeks. Advised her to notify of any worsening symptoms. ED precautions reviewed.   Patient Instructions  Wear Oxygen  2 L with activity and at bedtime, keep O2 sats >88-90%.  AYR saline  nasal gel Twice daily  .  Albuterol  inhaler 1-2 puffs every 4-6hr as needed.  Activity as tolerated Continue on inspire at bedtime, keep follow up with Neurology.   Complete levaquin course  Keep an eye on your symptoms and let us  know if you start feeling worse or do not continue to improve  Chest x ray when you return   Follow up with [employer] on the humidity for your oxygen   Follow up with Dr. Villa Greaser 5/20 after  lung function testing as scheduled. 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 Mild progression since 2005. See above. Awaiting repeat PFT. Prior PFT without formal restriction or obstruction. She did have a moderate diffusion defect. Will plan timing of next HRCT chest based on PFTs and clinical s/s of progression. Overall today improved.   Chronic respiratory failure with hypoxia (HCC) Stable without increased O2 requirements. She was able to maintain saturations with minimal exertion and at rest in office today. Encouraged her to continue utilizing with activity and at night for goal >88-90%. Need to reach out to [employer] to troubleshoot humidity.    Advised if symptoms do not improve or worsen, to please contact office for sooner follow up or seek emergency care.   I spent 35 minutes of dedicated to the care of this patient on the date of this encounter to include pre-visit review of records, face-to-face time with the patient discussing conditions above, post visit ordering of testing, clinical documentation with the electronic health record, making appropriate referrals as documented, and communicating necessary findings to members of the patients care team.  Roetta Clarke, NP 11/11/2023  Pt aware and understands NP's role.

## 2023-12-09 ENCOUNTER — Ambulatory Visit (HOSPITAL_BASED_OUTPATIENT_CLINIC_OR_DEPARTMENT_OTHER): Admitting: Pulmonary Disease

## 2023-12-09 DIAGNOSIS — J8410 Pulmonary fibrosis, unspecified: Secondary | ICD-10-CM

## 2023-12-09 LAB — PULMONARY FUNCTION TEST
DL/VA % pred: 94 %
DL/VA: 4 ml/min/mmHg/L
DLCO cor % pred: 71 %
DLCO cor: 12.34 ml/min/mmHg
DLCO unc % pred: 71 %
DLCO unc: 12.34 ml/min/mmHg
FEF 25-75 Post: 2.36 L/s
FEF 25-75 Pre: 2.36 L/s
FEF2575-%Change-Post: 0 %
FEF2575-%Pred-Post: 148 %
FEF2575-%Pred-Pre: 148 %
FEV1-%Change-Post: 1 %
FEV1-%Pred-Post: 103 %
FEV1-%Pred-Pre: 102 %
FEV1-Post: 1.97 L
FEV1-Pre: 1.95 L
FEV1FVC-%Change-Post: 0 %
FEV1FVC-%Pred-Pre: 112 %
FEV6-%Change-Post: 1 %
FEV6-%Pred-Post: 96 %
FEV6-%Pred-Pre: 94 %
FEV6-Post: 2.32 L
FEV6-Pre: 2.28 L
FEV6FVC-%Pred-Post: 105 %
FEV6FVC-%Pred-Pre: 105 %
FVC-%Change-Post: 0 %
FVC-%Pred-Post: 91 %
FVC-%Pred-Pre: 90 %
FVC-Post: 2.32 L
FVC-Pre: 2.3 L
Post FEV1/FVC ratio: 85 %
Post FEV6/FVC ratio: 100 %
Pre FEV1/FVC ratio: 85 %
Pre FEV6/FVC Ratio: 100 %
RV % pred: 63 %
RV: 1.33 L
TLC % pred: 76 %
TLC: 3.51 L

## 2023-12-09 NOTE — Progress Notes (Signed)
 Full PFT performed today.

## 2023-12-09 NOTE — Patient Instructions (Signed)
 Full PFT performed today.

## 2023-12-12 ENCOUNTER — Telehealth: Payer: Self-pay | Admitting: Adult Health

## 2023-12-12 NOTE — Telephone Encounter (Signed)
 Cmn received from [employer] for Oxygen Therapy.

## 2023-12-16 ENCOUNTER — Ambulatory Visit (INDEPENDENT_AMBULATORY_CARE_PROVIDER_SITE_OTHER): Admitting: Nurse Practitioner

## 2023-12-16 ENCOUNTER — Ambulatory Visit

## 2023-12-16 ENCOUNTER — Encounter: Payer: Self-pay | Admitting: Nurse Practitioner

## 2023-12-16 VITALS — BP 126/60 | HR 96 | Ht 61.0 in | Wt 176.8 lb

## 2023-12-16 DIAGNOSIS — J849 Interstitial pulmonary disease, unspecified: Secondary | ICD-10-CM

## 2023-12-16 DIAGNOSIS — J9611 Chronic respiratory failure with hypoxia: Secondary | ICD-10-CM

## 2023-12-16 DIAGNOSIS — J189 Pneumonia, unspecified organism: Secondary | ICD-10-CM

## 2023-12-16 DIAGNOSIS — Z87891 Personal history of nicotine dependence: Secondary | ICD-10-CM | POA: Diagnosis not present

## 2023-12-16 NOTE — Assessment & Plan Note (Addendum)
 Reported pneumonia diagnosis by PCP. Imaging not available. Complwrws levaquin course. Clinically improved. Will plan to repeat her CXR. Advised her to notify of any worsening symptoms  Patient Instructions  Wear Oxygen  2 L with activity and at bedtime, keep O2 sats >88-90%.  AYR saline nasal gel Twice daily  .  Albuterol  inhaler 1-2 puffs every 4-6hr as needed.  Activity as tolerated Continue on inspire at bedtime, keep follow up with Neurology.   Make sure you're using your oxygen  when you're doing more strenuous activities   Referral to pulmonary rehab - will have to be in person at this time but see if Medicare can help with transportation since this is for a medical reason  Chest x ray today  Glad you are feeling better!    Follow up in 4 months with Dr. Villa Greaser. If symptoms do not improve or worsen, please contact office for sooner follow up or seek emergency care.

## 2023-12-16 NOTE — Progress Notes (Signed)
 @Patient  ID: Lori Byrd, female    DOB: 06-Oct-1950, 73 y.o.   MRN: [id number]  Chief Complaint  Patient presents with   Follow-up    PFT result.    Referring provider: Sherial Dimes Key, *  HPI: 73 year old female, former smoker followed for ILD and chronic respiratory failure. She is a patient of Dr. Hortense Lyons and last seen in office by 11/11/2023 by Cleo Dace NP. Past medical history significant for CAD, DM, carotid artery stenosis, HTN, OSA - treated with Inspire, GERD, IBS, DM, HLD, anxiety, heart murmur, claudication, depression, fibromyalgia, obesity, RLS.  TEST/EVENTS:  01/18/2019 HST: AHI 53/h 11/26/2021 PFT: FEV1 96%, FVC 87%, ratio 84, DLCO 67%. No BD. ONO significant desaturations >4 hr 08/14/2022 HRCT chest: upper lobe predominant subpleural reticular opacities with traction btx and honeycombing changes. Slow progression. Not UIP. 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 Stable 5 mm RLL nodule.  09/2023 serologies negative, HP negative  12/09/2023 PFT: FVC 90, FEV1 102, ratio 85, DLC 76, DLCO 71  09/22/2023: OV with Parrett NP. No hx of amiodarone, macrodantin use. No hx of autoimmune disease. Has 1 dog. No birds/chickens.   [facility]opsitalized at [facility] recently. Having balance issues with falls and memory changes. Diagnosed with CVA. Discharged with PT/OT to start this week. Started on aspirin and statin. Following with neurology. She is also followed by neurology for OSA on Inspire. Prior walk test with exertional hypoxia 09/2021. Doing better since d/c. Starting PT and OT. 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 Using a walker. 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 Check autoimmune/CTD serology. ACE level. PFT upon return.   11/11/2023: OV with Glorious Flicker NP with long-term family friend.  Her daughter is also on the phone during the visit.  She had called in last week on 4/15 with reports of increased shortness of breath, body  aches, intermittent dizziness, weakness, productive cough with clear phlegm.  She was advised to go to the emergency room for further evaluation but she ended up being able to get in with her PCP who ordered a chest x-ray.  She was told that she had pneumonia.  The report and imaging is not available today.  They put her on Levaquin for 7 days.  She has 1 dose left. Feeling much better today.  Seems to be getting back to her baseline.  Still feels a little more short winded than she did prior to getting sick but overall cough is resolving and she is not having any more issues with body aches or night sweats.  Has not noticed any low oxygen  levels in the last couple days.  Utilizes her oxygen  occasionally with activity and then wears it at night.  She did use her rescue inhaler a couple times when she was sick but has not had to use it since.  Denies any fevers, chills, hemoptysis.  She does feel like her nose gets dried out with her oxygen .  She had called about adding humidity to her oxygen  but when they out of the bottle on, it just dumped water  into her nasal cannula.  She did call [facility].  She did not feel like was very helpful.  They said they do not typically send someone out except for every 2 weeks.  Did not offer to send out to troubleshoot the current problem.  She has not called them back.  She just took the humidity off the oxygen .  12/16/2023: Today - follow up  Patient presents today for follow up with her husband. Her daughters are also on the phone during the visit. She was treated for pneumonia in April by her PCP. She believes it was her right lung. Images not available. She is feeling much better. Seems to be back to her normal. She does have daily symptoms of shortness of breath and gets longer with any strenuous activities. Her family is trying to see if they can get her some help with household chores and being more active. She denies any cough, wheezing, chest congestion. No fevers,  chills, hemoptysis. She is using her oxygen  as need with activity, 2 lpm, but not having to use it 24/7 anymore. She is wearing it every night. No more issues with her nose feeling dry.  Her PFT was overall stable.   Allergies  Allergen Reactions   Penicillins Anaphylaxis    Did it involve swelling of the face/tongue/throat, SOB, or low BP? Yes Did it involve sudden or severe rash/hives, skin peeling, or any reaction on the inside of your mouth or nose? Yes Did you need to seek medical attention at a hospital or doctor's office? Yes When did it last happen? childhood      If all above answers are "NO", may proceed with cephalosporin use.    Asa [Aspirin] Other (See Comments)    Per Dr not to take; on Plavix    Toradol  [Ketorolac  Tromethamine ] Other (See Comments)    Told by MD not to take; on Plavix     Morphine  And Codeine Other (See Comments)    Pt preference    Tramadol Other (See Comments)    Pt does not like how this makes her feel    Immunization History  Administered Date(s) Administered   Fluad Quad(high Dose 65+) 05/21/2021   Influenza, High Dose Seasonal PF 05/05/2019, 05/31/2019   PFIZER(Purple Top)SARS-COV-2 Vaccination 10/15/2019, 11/12/2019   Pneumococcal Conjugate-13 04/19/2015, 05/26/2017   Tdap 04/19/2015   Zoster Recombinant(Shingrix) 04/19/2018, 08/22/2018   Zoster, Live 11/18/2012    Past Medical History:  Diagnosis Date   Acquired thrombophilia (HCC) 05/30/2021   Anemia 07/08/2015   Anxiety    Arteriosclerosis of both carotid arteries 07/08/2015   Arthritis    B12 deficiency    Bilateral bunions 07/08/2015   Bilateral carotid artery stenosis 08/11/2018   BMI 36.0-36.9,adult    Cancer (HCC)    skin cancer   Cardiac murmur 03/12/2022   Carotid artery stenosis    Chronic intermittent hypoxia with obstructive sleep apnea 06/22/2020   Chronic low back pain 07/08/2015   Chronic respiratory failure with hypoxia (HCC) 10/09/2021   Claudication (HCC)     COPD (chronic obstructive pulmonary disease) (HCC)    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/10/2018   Corn or callus 08/28/2017   Coronary arteriosclerosis 05/30/2021   Coronary artery disease    COVID-19 virus infection 11/21/2018   CPAP/BiPAP dependence 07/08/2015   Degenerative lumbar spinal stenosis 03/02/2020   Depression    Diabetes mellitus without complication (HCC)    Diabetic polyneuropathy associated with type 2 diabetes mellitus (HCC) 11/10/2018   DOE (dyspnea on exertion) 03/12/2022   Fibromyalgia    Fibromyalgia    GERD (gastroesophageal reflux disease)    Heart disease    Hyperlipidemia associated with type 2 diabetes mellitus (HCC)    Hypertension    IBS (irritable bowel syndrome)    Innominate artery stenosis (HCC) 07/08/2015   Formatting of this note might be different from the original.  Open stent in 11/2012 and restent in 07/2013   Intolerance of continuous positive airway pressure (CPAP) ventilation 11/10/2018   Lumbar radiculopathy 12/29/2020   Lumbar spondylosis 01/25/2021   Macular degeneration    Neuropathic pain of both feet 07/08/2015   Nodule of right lung 07/08/2015   Obesity 07/08/2015   Obesity (BMI 35.0-39.9 without comorbidity) 03/12/2022   Obstructive sleep apnea 02/19/2019   Osteopenia 12/08/2018   PAD (peripheral artery disease) (HCC) 11/10/2018   Peripheral vascular angioplasty status with implants and grafts 07/08/2015   Peripheral vascular disease (HCC) 03/02/2020   Primary fibromyalgia syndrome 03/02/2020   Recurrent insomnia 08/13/2019   Restless legs syndrome (RLS) 02/21/2016   Spondylolisthesis, grade 1 06/19/2020   Statin intolerance    Status post arterial stent 11/10/2018   Status post insertion of hypoglossal nerve stimulator 09/11/2021   Status post insertion of nerve stimulator 06/22/2020   Type 2 diabetes mellitus with diabetic mononeuropathy, without long-term current use of insulin   (HCC) 12/05/2015   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 [facility] 11/11/2023   Counseling given: Not Answered Tobacco comments: Quit 29 years ago. [facility] 11/11/2023   Outpatient Medications Prior to Visit  Medication Sig Dispense Refill   albuterol  (VENTOLIN  HFA) 108 (90 Base) MCG/ACT inhaler INHALE 1-2 PUFFS INTO THE LUNGS EVERY 6 HOURS AS NEEDED. 18 each 5   amLODipine  (NORVASC ) 10 MG tablet Take 10 mg by mouth daily.     atorvastatin (LIPITOR) 40 MG tablet Take 40 mg by mouth.     clopidogrel  (PLAVIX ) 75 MG tablet Take 1 tablet (75 mg total) by mouth daily. Restart plavix  on 07/03/19 30 tablet 12   Coenzyme Q10 (COQ10) 100 MG CAPS Take 100 mg by mouth daily.     diclofenac Sodium (VOLTAREN) 1 % GEL Apply 1 Application topically as needed for pain.     estradiol (ESTRACE) 0.1 MG/GM vaginal cream Place 1 Applicatorful vaginally once a week.     ezetimibe  (ZETIA ) 10 MG tablet Take 10 mg by mouth daily.     FARXIGA 5 MG TABS tablet Take 5 mg by mouth every morning.     fluticasone  (FLONASE ) 50 MCG/ACT nasal spray Place 1 spray into both nostrils daily.     gabapentin (NEURONTIN) 100 MG capsule Take 200 mg by mouth at bedtime.     meclizine (ANTIVERT) 25 MG tablet Take 25-50 mg by mouth 2 (two) times daily.     metoprolol succinate (TOPROL-XL) 100 MG 24 hr tablet Take 100 mg by mouth daily.     nitroGLYCERIN  (NITROSTAT ) 0.4 MG SL tablet Place 0.4 mg under the tongue every 5 (five) minutes as needed for chest pain.     OZEMPIC, 0.25 OR 0.5 MG/DOSE, 2 MG/3ML SOPN Inject 0.25 mg into the skin once a week.     pantoprazole  (PROTONIX ) 40 MG tablet Take 40 mg by mouth every morning.      RESTASIS  0.05 %  ophthalmic emulsion Place 1 drop into both eyes 2 (two) times daily.     venlafaxine  XR (EFFEXOR -XR) 75 MG 24 hr capsule Take 75 mg by mouth at bedtime.      No facility-administered medications prior to visit.  Review of Systems:   Constitutional: No weight loss or gain, night sweats, fevers, chills, or lassitude. +fatigue (baseline) HEENT: No headaches, difficulty swallowing, tooth/dental problems, or sore throat. No sneezing, itching, ear ache, nasal congestion, or post nasal drip CV:  No chest pain, orthopnea, PND, swelling in lower extremities, anasarca, dizziness, palpitations, syncope Resp: + shortness of breath with exertion. No cough. No excess mucus or change in color of mucus. No hemoptysis. No wheezing.  No chest wall deformity GI:  No heartburn, indigestion, abdominal pain, nausea, vomiting, diarrhea, change in bowel habits, loss of appetite, bloody stools.  GU: No dysuria, change in color of urine, urgency or frequency Skin: No rash, lesions, ulcerations MSK:  No joint pain or swelling.   Neuro: No memory impairment. +assistive device for ambulation  Psych: No depression or anxiety. Mood stable.     Physical Exam:  BP 126/60 (BP Location: Left Arm, Patient Position: Sitting, Cuff Size: Normal)   Pulse 96   Ht 5\' 1"  (1.549 m)   Wt 176 lb 12.8 oz (80.2 kg)   SpO2 90%   BMI 33.41 kg/m   GEN: Pleasant, interactive, well-kempt; in no acute distress HEENT:  Normocephalic and atraumatic. PERRLA. Sclera white. Nasal turbinates pink, moist and patent bilaterally. No rhinorrhea present. Oropharynx pink and moist, without exudate or edema. No lesions, ulcerations, or postnasal drip.  NECK:  Supple w/ fair ROM. No JVD present. Normal carotid impulses w/o bruits. Thyroid  symmetrical with no goiter or nodules palpated. No lymphadenopathy.   CV: RRR, no m/r/g, no peripheral edema. Pulses intact, +2 bilaterally. No cyanosis, pallor or clubbing. PULMONARY:  Unlabored, regular  breathing. Clear bilaterally A&P w/o wheezes/rales/rhonchi. No accessory muscle use.  GI: BS present and normoactive. Soft, non-tender to palpation. No organomegaly or masses detected.  MSK: No erythema, warmth or tenderness. Cap refil <2 sec all extrem. No deformities or joint swelling noted.  Neuro: A/Ox3. No focal deficits noted.   Skin: Warm, no lesions or rashe Psych: Normal affect and behavior. Judgement and thought content appropriate.     Lab Results:  CBC    Component Value Date/Time   WBC 4.5 10/09/2021 1157   RBC 4.63 10/09/2021 1157   HGB 13.2 10/09/2021 1157   HCT 40.5 10/09/2021 1157   PLT 269.0 10/09/2021 1157   MCV 87.5 10/09/2021 1157   MCH 29.2 06/28/2019 0820   MCHC 32.7 10/09/2021 1157   RDW 14.9 10/09/2021 1157   LYMPHSABS 1.6 10/09/2021 1157   MONOABS 0.4 10/09/2021 1157   EOSABS 0.2 10/09/2021 1157   BASOSABS 0.0 10/09/2021 1157    BMET    Component Value Date/Time   NA 137 10/09/2021 1157   K 4.1 10/09/2021 1157   CL 97 10/09/2021 1157   CO2 30 10/09/2021 1157   GLUCOSE 103 (H) 10/09/2021 1157   BUN 16 10/09/2021 1157   CREATININE 0.99 10/09/2021 1157   CALCIUM 10.0 10/09/2021 1157   GFRNONAA >60 06/28/2019 0820   GFRAA >60 06/28/2019 0820    BNP    Component Value Date/Time   BNP 18.7 11/22/2018 0830     Imaging:  No results found.  Administration History     None          Latest Ref Rng & Units 12/09/2023   12:52 PM 11/26/2021    1:10 PM  PFT Results  FVC-Pre L 2.30  2.31   FVC-Predicted Pre % 90  87   FVC-Post L 2.32  2.30   FVC-Predicted Post %  91  87   Pre FEV1/FVC % % 85  82   Post FEV1/FCV % % 85  84   FEV1-Pre L 1.95  1.90   FEV1-Predicted Pre % 102  95   FEV1-Post L 1.97  1.92   DLCO uncorrected ml/min/mmHg 12.34  11.80   DLCO UNC% % 71  67   DLCO corrected ml/min/mmHg 12.34  11.87   DLCO COR %Predicted % 71  67   DLVA Predicted % 94  83   TLC L 3.51  4.02   TLC % Predicted % 76  87   RV % Predicted % 63   81     No results found for: "NITRICOXIDE"      Assessment & Plan:   CAP (community acquired pneumonia) Reported pneumonia diagnosis by PCP. Imaging not available. Complwrws levaquin course. Clinically improved. Will plan to repeat her CXR. Advised her to notify of any worsening symptoms  Patient Instructions  Wear Oxygen  2 L with activity and at bedtime, keep O2 sats >88-90%.  AYR saline nasal gel Twice daily  .  Albuterol  inhaler 1-2 puffs every 4-6hr as needed.  Activity as tolerated Continue on inspire at bedtime, keep follow up with Neurology.   Make sure you're using your oxygen  when you're doing more strenuous activities   Referral to pulmonary rehab - will have to be in person at this time but see if Medicare can help with transportation since this is for a medical reason  Chest x ray today  Glad you are feeling better!    Follow up in 4 months with Dr. Villa Greaser. If symptoms do not improve or worsen, please contact office for sooner follow up or seek emergency care.    ILD (interstitial lung disease) (HCC) ILD of questionable etiology. Mild progression since 2005. See above. Stable PFT. Will plan timing of next HRCT chest following CXR. Overall today improved. Encouraged to work on graded exercises. Referral to pulmonary rehab.   Chronic respiratory failure with hypoxia (HCC) Encouraged to continue supplemental oxygen  PRN activity for goal> 88-90% and at night    Advised if symptoms do not improve or worsen, to please contact office for sooner follow up or seek emergency care.   I spent 35 minutes of dedicated to the care of this patient on the date of this encounter to include pre-visit review of records, face-to-face time with the patient discussing conditions above, post visit ordering of testing, clinical documentation with the electronic health record, making appropriate referrals as documented, and communicating necessary findings to members of the patients care  team.  Roetta Clarke, NP 12/16/2023  Pt aware and understands NP's role.

## 2023-12-16 NOTE — Patient Instructions (Addendum)
 Wear Oxygen  2 L with activity and at bedtime, keep O2 sats >88-90%.  AYR saline nasal gel Twice daily  .  Albuterol  inhaler 1-2 puffs every 4-6hr as needed.  Activity as tolerated Continue on inspire at bedtime, keep follow up with Neurology.   Make sure you're using your oxygen  when you're doing more strenuous activities   Referral to pulmonary rehab - will have to be in person at this time but see if Medicare can help with transportation since this is for a medical reason  Chest x ray today  Glad you are feeling better!    Follow up in 4 months with Dr. Villa Greaser. If symptoms do not improve or worsen, please contact office for sooner follow up or seek emergency care.

## 2023-12-16 NOTE — Assessment & Plan Note (Signed)
 Encouraged to continue supplemental oxygen  PRN activity for goal> 88-90% and at night

## 2023-12-16 NOTE — Assessment & Plan Note (Signed)
 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 Mild progression since 2005. See above. Stable PFT. Will plan timing of next HRCT chest following CXR. Overall today improved. Encouraged to work on graded exercises. Referral to pulmonary rehab.

## 2023-12-17 ENCOUNTER — Ambulatory Visit: Payer: Self-pay | Admitting: Adult Health

## 2023-12-17 ENCOUNTER — Telehealth (HOSPITAL_COMMUNITY): Payer: Self-pay | Admitting: *Deleted

## 2023-12-17 NOTE — Telephone Encounter (Signed)
 Received referral from Dr. Alva for this pt to participate in Pulmonary rehab with the diagnosis of ILD.  Noted pt lives in [place].  Called and spoke to [place] who was in between deciding which program to participate.  Since she does not drive and would have to get her family to help figured that [facility] would be the better choice.  With permission, Referral faxed to [facility] per pt preference. Lettie Ray, BSN Cardiac and Emergency planning/management officer

## 2023-12-25 ENCOUNTER — Ambulatory Visit (HOSPITAL_BASED_OUTPATIENT_CLINIC_OR_DEPARTMENT_OTHER)
Admission: EM | Admit: 2023-12-25 | Discharge: 2023-12-25 | Disposition: A | Discharge status: Home / Self Care | Attending: Family Medicine | Admitting: Family Medicine

## 2023-12-25 ENCOUNTER — Encounter (HOSPITAL_BASED_OUTPATIENT_CLINIC_OR_DEPARTMENT_OTHER): Payer: Self-pay | Admitting: Emergency Medicine

## 2023-12-25 DIAGNOSIS — R350 Frequency of micturition: Secondary | ICD-10-CM

## 2023-12-25 DIAGNOSIS — M545 Low back pain, unspecified: Secondary | ICD-10-CM | POA: Diagnosis present

## 2023-12-25 LAB — POCT URINALYSIS DIP (MANUAL ENTRY)
Bilirubin, UA: NEGATIVE
Blood, UA: NEGATIVE
Glucose, UA: >=1000 mg/dL — AB
Leukocytes, UA: NEGATIVE
Nitrite, UA: NEGATIVE
Protein Ur, POC: 30 mg/dL — AB
Spec Grav, UA: >=1.03 — AB
Urobilinogen, UA: 1 U/dL
pH, UA: 5.5

## 2023-12-25 NOTE — ED Triage Notes (Signed)
 Pt c/o urinary frequency and lower back pain x 3 days.

## 2023-12-25 NOTE — ED Provider Notes (Signed)
 [facility]    CSN: [id number] Arrival date & time: 12/25/23  1308      History   Chief Complaint No chief complaint on file.   HPI Lori Byrd is a 73 y.o. female.   Patient reports urinary frequency and lower back pain that started on 12/23/2023.  She denies any burning with urination.  She has mild to moderate lower abdominal pain.  She denies fever, nausea, vomiting, constipation, diarrhea.  Her back pain is low, just above her buttocks.  It is not at the flank.     Past Medical History:  Diagnosis Date   Acquired thrombophilia (HCC) 05/30/2021   Anemia 07/08/2015   Anxiety    Arteriosclerosis of both carotid arteries 07/08/2015   Arthritis    B12 deficiency    Bilateral bunions 07/08/2015   Bilateral carotid artery stenosis 08/11/2018   BMI 36.0-36.9,adult    Cancer (HCC)    skin cancer   Cardiac murmur 03/12/2022   Carotid artery stenosis    Chronic intermittent hypoxia with obstructive sleep apnea 06/22/2020   Chronic low back pain 07/08/2015   Chronic respiratory failure with hypoxia (HCC) 10/09/2021   Claudication (HCC)    COPD (chronic obstructive pulmonary disease) (HCC)    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/10/2018   Corn or callus 08/28/2017   Coronary arteriosclerosis 05/30/2021   Coronary artery disease    COVID-19 virus infection 11/21/2018   CPAP/BiPAP dependence 07/08/2015   Degenerative lumbar spinal stenosis 03/02/2020   Depression    Diabetes mellitus without complication (HCC)    Diabetic polyneuropathy associated with type 2 diabetes mellitus (HCC) 11/10/2018   DOE (dyspnea on exertion) 03/12/2022   Fibromyalgia    Fibromyalgia    GERD (gastroesophageal reflux disease)    Heart disease    Hyperlipidemia associated with type 2 diabetes mellitus (HCC)    Hypertension    IBS (irritable bowel syndrome)    Innominate artery stenosis (HCC) 07/08/2015   Formatting of this note  might be different from the original. Open stent in 11/2012 and restent in 07/2013   Intolerance of continuous positive airway pressure (CPAP) ventilation 11/10/2018   Lumbar radiculopathy 12/29/2020   Lumbar spondylosis 01/25/2021   Macular degeneration    Neuropathic pain of both feet 07/08/2015   Nodule of right lung 07/08/2015   Obesity 07/08/2015   Obesity (BMI 35.0-39.9 without comorbidity) 03/12/2022   Obstructive sleep apnea 02/19/2019   Osteopenia 12/08/2018   PAD (peripheral artery disease) (HCC) 11/10/2018   Peripheral vascular angioplasty status with implants and grafts 07/08/2015   Peripheral vascular disease (HCC) 03/02/2020   Primary fibromyalgia syndrome 03/02/2020   Recurrent insomnia 08/13/2019   Restless legs syndrome (RLS) 02/21/2016   Spondylolisthesis, grade 1 06/19/2020   Statin intolerance    Status post arterial stent 11/10/2018   Status post insertion of hypoglossal nerve stimulator 09/11/2021   Status post insertion of nerve stimulator 06/22/2020   Type 2 diabetes mellitus with diabetic mononeuropathy, without long-term current use of insulin  (HCC) 12/05/2015   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, without long-term current use of insulin  (HCC) 11/10/2018    Patient Active Problem List   Diagnosis Date Noted   CAP (community acquired pneumonia) 11/11/2023   ILD (interstitial lung disease) (HCC) 09/22/2023   OSA (obstructive sleep apnea) 09/22/2023   Pulmonary fibrosis (HCC) 08/15/2022   Obesity (BMI 35.0-39.9 without comorbidity) 03/12/2022   DOE (dyspnea on exertion) 03/12/2022  Cardiac murmur 03/12/2022   Anxiety 02/08/2022   Arthritis 02/08/2022   B12 deficiency 02/08/2022   BMI 36.0-36.9,adult 02/08/2022   Cancer (HCC) 02/08/2022   Carotid artery stenosis 02/08/2022   Claudication (HCC) 02/08/2022   Coronary artery disease 02/08/2022   Depression 02/08/2022   Diabetes mellitus without complication (HCC)  02/08/2022   Fibromyalgia 02/08/2022   GERD (gastroesophageal reflux disease) 02/08/2022   Heart disease 02/08/2022   Hyperlipidemia associated with type 2 diabetes mellitus (HCC) 02/08/2022   Hypertension 02/08/2022   IBS (irritable bowel syndrome) 02/08/2022   Macular degeneration 02/08/2022   Statin intolerance 02/08/2022   Chronic respiratory failure with hypoxia (HCC) 10/09/2021   Status post insertion of hypoglossal nerve stimulator 09/11/2021   Acquired thrombophilia (HCC) 05/30/2021   Coronary arteriosclerosis 05/30/2021   Lumbar spondylosis 01/25/2021   Lumbar radiculopathy 12/29/2020   Chronic intermittent hypoxia with obstructive sleep apnea 06/22/2020   Status post insertion of nerve stimulator 06/22/2020   Spondylolisthesis, grade 1 06/19/2020   Degenerative lumbar spinal stenosis 03/02/2020   Primary fibromyalgia syndrome 03/02/2020   Peripheral vascular disease (HCC) 03/02/2020   Recurrent insomnia 08/13/2019   Obstructive sleep apnea 02/19/2019   Osteopenia 12/08/2018   COVID-19 virus infection 11/21/2018   Intolerance of continuous positive airway pressure (CPAP) ventilation 11/10/2018   PAD (peripheral artery disease) (HCC) 11/10/2018   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, without long-term current use of insulin  (HCC) 11/10/2018   Diabetic polyneuropathy associated with type 2 diabetes mellitus (HCC) 11/10/2018   Status post arterial stent 11/10/2018   Bilateral carotid artery stenosis 08/11/2018   Corn or callus 08/28/2017   Restless legs syndrome (RLS) 02/21/2016   Type 2 diabetes mellitus with diabetic mononeuropathy, without long-term current use of insulin  (HCC) 12/05/2015   Anemia 07/08/2015   Bilateral bunions 07/08/2015   CPAP/BiPAP dependence 07/08/2015   Neuropathic pain of both feet 07/08/2015   Nodule of right lung 07/08/2015   Obesity 07/08/2015   Arteriosclerosis of both carotid arteries 07/08/2015    Innominate artery stenosis (HCC) 07/08/2015   Chronic low back pain 07/08/2015   Peripheral vascular angioplasty status with implants and grafts 07/08/2015    Past Surgical History:  Procedure Laterality Date   CARDIAC CATHETERIZATION     DRUG INDUCED ENDOSCOPY N/A 02/19/2019   Procedure: DRUG INDUCED SLEEP ENDOSCOPY;  Surgeon: Ammon Bales, MD;  Location: [facility];  Service: ENT;  Laterality: N/A;   EYE SURGERY Bilateral    cataract   IMPLANTATION OF HYPOGLOSSAL NERVE STIMULATOR Right 06/30/2019   Procedure: IMPLANTATION OF HYPOGLOSSAL NERVE STIMULATOR;  Surgeon: Ammon Bales, MD;  Location: [facility] OR;  Service: ENT;  Laterality: Right;  Neck, chest and lower lateral chest   SKIN BIOPSY     SMALL INTESTINE SURGERY     TUBAL LIGATION     two carotid stent      OB History   No obstetric history on file.      Home Medications    Prior to Admission medications   Medication Sig Start Date End Date Taking? Authorizing Provider  albuterol  (VENTOLIN  HFA) 108 (90 Base) MCG/ACT inhaler INHALE 1-2 PUFFS INTO THE LUNGS EVERY 6 HOURS AS NEEDED. 09/30/23  Yes Lind Repine, MD  amLODipine  (NORVASC ) 10 MG tablet Take 10 mg by mouth daily.   Yes [provider]  atorvastatin (LIPITOR) 40 MG tablet Take 40 mg by mouth. 09/17/23  Yes [provider]  clopidogrel  (PLAVIX ) 75 MG tablet Take  1 tablet (75 mg total) by mouth daily. Restart plavix  on 07/03/19 07/03/19  Yes Ammon Bales, MD  ezetimibe  (ZETIA ) 10 MG tablet Take 10 mg by mouth daily. 12/27/21  Yes [provider]  FARXIGA 5 MG TABS tablet Take 5 mg by mouth every morning. 09/12/21  Yes [provider]  metoprolol succinate (TOPROL-XL) 100 MG 24 hr tablet Take 100 mg by mouth daily. 02/11/22  Yes [provider]  OZEMPIC, 0.25 OR 0.5 MG/DOSE, 2 MG/3ML SOPN Inject 0.25 mg into the skin once a week. 02/11/22  Yes [provider]  venlafaxine  XR (EFFEXOR -XR) 75 MG 24 hr  capsule Take 75 mg by mouth at bedtime.  09/08/18  Yes [provider]  Coenzyme Q10 (COQ10) 100 MG CAPS Take 100 mg by mouth daily.    [provider]  diclofenac Sodium (VOLTAREN) 1 % GEL Apply 1 Application topically as needed for pain.    [provider]  estradiol (ESTRACE) 0.1 MG/GM vaginal cream Place 1 Applicatorful vaginally once a week. 03/29/22   [provider]  fluticasone  (FLONASE ) 50 MCG/ACT nasal spray Place 1 spray into both nostrils daily. 01/31/22   [provider]  gabapentin (NEURONTIN) 100 MG capsule Take 200 mg by mouth at bedtime. 01/31/22   [provider]  meclizine (ANTIVERT) 25 MG tablet Take 25-50 mg by mouth 2 (two) times daily. 02/23/22   [provider]  nitroGLYCERIN  (NITROSTAT ) 0.4 MG SL tablet Place 0.4 mg under the tongue every 5 (five) minutes as needed for chest pain.    [provider]  pantoprazole  (PROTONIX ) 40 MG tablet Take 40 mg by mouth every morning.  08/02/18   [provider]  RESTASIS  0.05 % ophthalmic emulsion Place 1 drop into both eyes 2 (two) times daily. 02/11/22   [provider]    Family History Family History  Problem Relation Age of Onset   Hypertension Mother    Heart failure Mother    Diabetes Mother    Arthritis Mother    Heart attack Father    Diabetes Sister    Skin cancer Sister    Pancreatic cancer Brother    Diabetes Maternal Grandmother     Social History Social History   Tobacco Use   Smoking status: Former    Types: Cigarettes    Passive exposure: Past   Smokeless tobacco: Never   Tobacco comments:    Quit 29 years ago. [facility] 11/11/2023  Vaping Use   Vaping status: Never Used  Substance Use Topics   Alcohol use: Not Currently   Drug use: Never     Allergies   Penicillins, Asa [aspirin], Toradol  [ketorolac  tromethamine ], Morphine  and codeine, and Tramadol   Review of Systems Review of Systems  Constitutional:   Negative for fever.  Respiratory:  Negative for cough.   Cardiovascular:  Negative for chest pain.  Gastrointestinal:  Negative for abdominal pain, constipation, diarrhea, nausea and vomiting.  Genitourinary:  Positive for frequency. Negative for dysuria, flank pain, hematuria and urgency.  Musculoskeletal:  Positive for back pain. Negative for arthralgias.  Skin:  Negative for color change and rash.  Neurological:  Negative for syncope.  All other systems reviewed and are negative.    Physical Exam Triage Vital Signs ED Triage Vitals  Encounter Vitals Group     BP 12/25/23 1329 108/73     Systolic BP Percentile --      Diastolic BP Percentile --      Pulse Rate  12/25/23 1329 (!) 118     Resp 12/25/23 1329 18     Temp 12/25/23 1329 98.6 F (37 C)     Temp Source 12/25/23 1329 Oral     SpO2 12/25/23 1329 93 %     Weight --      Height --      Head Circumference --      Peak Flow --      Pain Score 12/25/23 1328 7     Pain Loc --      Pain Education --      Exclude from Growth Chart --    No data found.  Updated Vital Signs BP 108/73 (BP Location: Left Arm)   Pulse (!) 118   Temp 98.6 F (37 C) (Oral)   Resp 18   SpO2 93%   Visual Acuity Right Eye Distance:   Left Eye Distance:   Bilateral Distance:    Right Eye Near:   Left Eye Near:    Bilateral Near:     Physical Exam Vitals and nursing note reviewed.  Constitutional:      General: She is not in acute distress.    Appearance: She is well-developed. She is not ill-appearing or toxic-appearing.  HENT:     Head: Normocephalic and atraumatic.     Right Ear: Hearing, tympanic membrane, ear canal and external ear normal.     Left Ear: Hearing, tympanic membrane, ear canal and external ear normal.     Nose: No congestion or rhinorrhea.     Right Sinus: No maxillary sinus tenderness or frontal sinus tenderness.     Left Sinus: No maxillary sinus tenderness or frontal sinus tenderness.     Mouth/Throat:      Lips: Pink.     Mouth: Mucous membranes are moist.     Pharynx: Uvula midline. No oropharyngeal exudate or posterior oropharyngeal erythema.     Tonsils: No tonsillar exudate.  Eyes:     Conjunctiva/sclera: Conjunctivae normal.     Pupils: Pupils are equal, round, and reactive to light.  Cardiovascular:     Rate and Rhythm: Normal rate and regular rhythm.     Heart sounds: S1 normal and S2 normal. No murmur heard. Pulmonary:     Effort: Pulmonary effort is normal. No respiratory distress.     Breath sounds: Normal breath sounds. No decreased breath sounds, wheezing, rhonchi or rales.  Abdominal:     General: Bowel sounds are normal.     Palpations: Abdomen is soft.     Tenderness: There is abdominal tenderness (Mild to moderate) in the right lower quadrant, suprapubic area and left lower quadrant. There is no right CVA tenderness, left CVA tenderness, guarding or rebound. Negative signs include Murphy's sign, Rovsing's sign and McBurney's sign.  Musculoskeletal:        General: No swelling.     Cervical back: Normal and neck supple.     Thoracic back: Normal.     Lumbar back: Tenderness (Moderate from L1 to S-1) present. No spasms.  Lymphadenopathy:     Head:     Right side of head: No submental, submandibular, tonsillar, preauricular or posterior auricular adenopathy.     Left side of head: No submental, submandibular, tonsillar, preauricular or posterior auricular adenopathy.     Cervical: No cervical adenopathy.     Right cervical: No superficial cervical adenopathy.    Left cervical: No superficial cervical adenopathy.  Skin:    General: Skin is warm and dry.  Capillary Refill: Capillary refill takes less than 2 seconds.     Findings: No rash.  Neurological:     Mental Status: She is alert and oriented to person, place, and time.  Psychiatric:        Mood and Affect: Mood normal.      UC Treatments / Results  Labs (all labs ordered are listed, but only abnormal  results are displayed) Labs Reviewed  POCT URINALYSIS DIP (MANUAL ENTRY) - Abnormal; Notable for the following components:      Result Value   Glucose, UA >=1,000 (*)    Ketones, POC UA small (15) (*)    Spec Grav, UA >=1.030 (*)    Protein Ur, POC =30 (*)    All other components within normal limits  URINE CULTURE   Comprehensive Metabolic Panel: 09/15/2023: Order: [id number] Component Ref Range & Units 3 mo ago  Na 136 - 146 mmol/L 141  Potassium 3.7 - 5.4 mmol/L 3.8  Cl 97 - 108 mmol/L 101  CO2 20 - 32 mmol/L 28  AGAP 7 - 16 mmol/L 12  Glucose 65 - 99 mg/dL 191 High   BUN 8 - 27 mg/dL 17  Creatinine 4.78 - 2.95 mg/dL 6.21  Ca 8.6 - 30.8 mg/dL 8.8  ALK PHOS 25 - 657 U/L 76  T Bili 0.0 - 1.2 mg/dL 0.3  Total Protein 6.0 - 8.5 gm/dL 6.9  Alb 3.5 - 4.8 gm/dL 3.9  GLOBULIN 1.5 - 4.5 gm/dL 3.0  ALBUMIN/GLOBULIN RATIO 1.1 - 2.5 1.3  BUN/CREAT RATIO 11.0 - 26.0 17.7  ALT 0 - 40 U/L 16  AST 0 - 40 U/L 22  eGFR >=60 mL/min/1.37m2 63  Comment: Normal GFR (glomerular filtration rate) > 60 mL/min/1.73 meters squared, < 60 may include impaired kidney function. Calculation based on the Chronic Kidney Disease Epidemiology Collaboration (CK-EPI)equation refit without adjustment for race.  Resulting Agency [facility]   EKG  Radiology No results found.  Procedures Procedures (including critical care time)  Medications Ordered in UC Medications - No data to display  Initial Impression / Assessment and Plan / UC Course  I have reviewed the triage vital signs and the nursing notes.  Pertinent labs & imaging results that were available during my care of the patient were reviewed by me and considered in my medical decision making (see chart for details).  Plan of Care: Urinary frequency: Urinalysis is normal but does show ketones and glucose.  The patient is on Farxiga for diabetes so this is a normal urinalysis.  No sign of acute UTI.  Urine culture  sent due to her symptoms.  Will adjust the plan of care, if needed once the culture results.  Encouraged to make an appointment at [facility] urology so that she keeps her connection with that group and can follow-up there for the urinary frequency, if needed.  Lower back pain: Her back pain is lower lumbar and sacral, not flank pain or kidney level pain.  Advised that she should use the tramadol and gabapentin as provided by Dr. Merlyn Starring.  She reports she rarely uses them and has not used them in some time because she is afraid that they are over sedating.  Follow-up with Dr. Natasha Bailiff if back pain persists.  Return here if symptoms do not improve, worsen or new symptoms occur.  I reviewed the plan of care with the patient and/or the patient's guardian.  The patient and/or guardian had time to ask questions and acknowledged that the  questions were answered.  I provided instruction on symptoms or reasons to return here or to go to an ER, if symptoms/condition did not improve, worsened or if new symptoms occurred.  Final Clinical Impressions(s) / UC Diagnoses   Final diagnoses:  Acute bilateral low back pain without sciatica  Urinary frequency     Discharge Instructions      Urinary frequency: Urinalysis was essentially normal with ketones and glucose but the patient is on Farxiga.  Urine culture sent.  Will adjust the plan of care, if needed once the culture results.  Encouraged to make an appointment with urology so she can be reevaluated and keep her established visits with a local urologist.  Lower back pain: The patient's back pain is very low back not flank pain, not kidney pain.  She has tramadol and gabapentin from Dr. Merlyn Starring for chronic pain.  She reports she rarely uses them.  Advised that she should use them if her back continues to bother her she should follow-up with Dr. Merlyn Starring.  Follow-up here if symptoms do not improve, worsen or new symptoms occur.   ED Prescriptions   None    I  have reviewed the PDMP during this encounter.   Guss Legacy, FNP 12/25/23 1455

## 2023-12-25 NOTE — Discharge Instructions (Addendum)
 Urinary frequency: Urinalysis was essentially normal with ketones and glucose but the patient is on Farxiga.  Urine culture sent.  Will adjust the plan of care, if needed once the culture results.  Encouraged to make an appointment with urology so she can be reevaluated and keep her established visits with a local urologist.  Lower back pain: The patient's back pain is very low back not flank pain, not kidney pain.  She has tramadol and gabapentin from Dr. Merlyn Starring for chronic pain.  She reports she rarely uses them.  Advised that she should use them if her back continues to bother her she should follow-up with Dr. Merlyn Starring.  Follow-up here if symptoms do not improve, worsen or new symptoms occur.

## 2023-12-26 ENCOUNTER — Telehealth: Payer: Self-pay | Admitting: Student

## 2023-12-26 ENCOUNTER — Ambulatory Visit (HOSPITAL_BASED_OUTPATIENT_CLINIC_OR_DEPARTMENT_OTHER): Payer: Self-pay | Admitting: Family Medicine

## 2023-12-26 ENCOUNTER — Ambulatory Visit: Payer: Self-pay | Admitting: Nurse Practitioner

## 2023-12-26 LAB — URINE CULTURE: Culture: NO GROWTH

## 2023-12-26 NOTE — Telephone Encounter (Signed)
 Rc'd fax from [facility] for O2 therapy. Will fwd to Ms. Cobb to sign and  ret to front.

## 2023-12-26 NOTE — Progress Notes (Signed)
 She has some changes on her chest x ray that are all stable compared to prior scans. No residual infection. Thanks.

## 2023-12-26 NOTE — Progress Notes (Signed)
 Urine Culture is negative.  Patient updated via VM message  No UTI.  Advised to call us , if symptoms are not improving.  She might need a recheck regarding her back.

## 2024-01-07 NOTE — Telephone Encounter (Signed)
 Rc'd 2nd req from [facility] for O2 therapy. Still waiting for NP's signature and return to front desk for processing.

## 2024-01-19 ENCOUNTER — Telehealth: Payer: Self-pay | Admitting: *Deleted

## 2024-01-19 NOTE — Telephone Encounter (Signed)
 Received CMN from [facility] regarding oxygen .  Form signed and faxed to [phone].  Reached a busy signal.  Will await redial.  Faxed form again at 4:14 pm.  Received confirmation fax that fax was sent successfully.  Nothing further needed.

## 2024-02-02 ENCOUNTER — Encounter (HOSPITAL_BASED_OUTPATIENT_CLINIC_OR_DEPARTMENT_OTHER): Payer: Self-pay

## 2024-02-02 ENCOUNTER — Ambulatory Visit (INDEPENDENT_AMBULATORY_CARE_PROVIDER_SITE_OTHER)
Admit: 2024-02-02 | Discharge: 2024-02-02 | Disposition: A | Discharge status: Home / Self Care | Attending: Family Medicine | Admitting: Family Medicine

## 2024-02-02 ENCOUNTER — Ambulatory Visit (HOSPITAL_BASED_OUTPATIENT_CLINIC_OR_DEPARTMENT_OTHER)
Admission: EM | Admit: 2024-02-02 | Discharge: 2024-02-02 | Disposition: A | Discharge status: Home / Self Care | Attending: Family Medicine | Admitting: Family Medicine

## 2024-02-02 ENCOUNTER — Ambulatory Visit: Payer: Self-pay | Admitting: Adult Health

## 2024-02-02 DIAGNOSIS — R079 Chest pain, unspecified: Secondary | ICD-10-CM | POA: Diagnosis not present

## 2024-02-02 DIAGNOSIS — R0602 Shortness of breath: Secondary | ICD-10-CM | POA: Diagnosis not present

## 2024-02-02 NOTE — Telephone Encounter (Signed)
 FYI Only or Action Required?: Action required by provider: refusing hospital, may head to UC, needs call back with further recommendations.  Patient is followed in Pulmonology for OSA, pulmonary fibrosis, and ILD, last seen on 12/16/2023 by Malachy Comer GAILS, NP.  Called Nurse Triage reporting Chest Pain, Shortness of Breath, Shaking, and low oxygen  level.  Symptoms began several days ago.  Interventions attempted: Home oxygen  use and Increased fluids/rest.  Symptoms are: rapidly worsening.  Triage Disposition: Call EMS 911 Now  Patient/caregiver understands and will follow disposition?: No, refuses disposition      Copied from CRM [id number]. Topic: Clinical - Red Word Triage >> Feb 02, 2024 12:56 PM Whitney O wrote: [facility] that prompted transfer to Nurse Triage: been having chest pains lungs hurting and hard time breathing Reason for Disposition  Sounds like a life-threatening emergency to the triager  Answer Assessment - Initial Assessment Questions E2C2 Pulmonary Triage - Initial Assessment Questions Chief Complaint (e.g., cough, sob, wheezing, fever, chills, sweat or additional symptoms) *Go to specific symptom protocol after initial questions. In my chest, it's my lungs, I know that's what it is Chest pain not constant, just when trying to get breath back when oxygen  lowers 4-5/10 pain right there below my neck, sometimes hard for me to breathe oxygen  level lower, happens after PT each time for few days Shaking, about shaking my body, just shake when oxygen  level goes No nausea, sweating, or too weak to stand Last few days not been right Maybe I'm sick and don't know it Supposed to see PCP on Wednesday, not heard back from them yet  How long have symptoms been present? Past few days  Have you used your inhalers/maintenance medication? No If yes, What medications? Just oxygen  Have a rescue inhaler but not used it, at home, not with her  OXYGEN : Do you wear  supplemental oxygen ? Yes If yes, How many liters are you supposed to use? Just when need it, increased need past few days, 2L, at therapy they have to put it up to 3L  Do you monitor your oxygen  levels? Yes If yes, What is your reading (oxygen  level) today? 94% with oxygen  HR 118, HR was 128  What is your usual oxygen  saturation reading?  (Note: Pulmonary O2 sats should be 90% or greater) Sleep without oxygen  down to 77%, usually put on oxygen  when take nap, happened to fall asleep, couple hours ago  7. CARDIAC RISK FACTORS: Do you have any history of heart problems or risk factors for heart disease? (e.g., angina, prior heart attack; diabetes, high blood pressure, high cholesterol, smoker, or strong family history of heart disease)     significant 8. PULMONARY RISK FACTORS: Do you have any history of lung disease?  (e.g., blood clots in lung, asthma, emphysema, birth control pills)     Significant  Advised pt go to hospital right away, call 911 if any worsening. Pt preferring UC at this time, advised pt have another adult drive her, pt verbalized agreement. Sending message to pulm for call back with further recommendations.  Protocols used: Chest Pain-A-AH

## 2024-02-02 NOTE — Telephone Encounter (Signed)
 Routing to DOD

## 2024-02-02 NOTE — Discharge Instructions (Addendum)
 Your xray did not show any pneumonia. This may be viral illness.  If the symptoms worsen I would go to the ER for CT scan of the chest.  Otherwise see you doctor for follow up.

## 2024-02-02 NOTE — Telephone Encounter (Signed)
 Called and spoke with the patient. She seen UC today. I advised pt if O2 levels continue to drop to seek emergency help, pt verbalized understanding and has been scheduled appt with Dr. Jude

## 2024-02-02 NOTE — ED Triage Notes (Signed)
 Pt c/o SOBr and chest pain for the last 3 days. She did pulmonary therapy this morning and it made her breathing worst. Pt has a history of pulmonary fibrosis and used O2 2L at night but has been using it all day today. Her daughter states her O2 sat was 77 this morning. She has an inhaler at home but has not used it. Chest pain is described as tightness like her lungs are not fully expanding.

## 2024-02-03 NOTE — ED Provider Notes (Signed)
 [facility]    CSN: [id number] Arrival date & time: 02/02/24  1334      History   Chief Complaint Chief Complaint  Patient presents with   Shortness of Breath    HPI Lori Byrd is a 73 y.o. female.   Patient is a 73 year old female who presents today with chest pain and shortness of breath for the last 3 days. She did pulmonary therapy this morning and it made her breathing worse. Pt has a history of pulmonary fibrosis and used O2 2L at night but has been using it all day today. Her daughter states her O2 sat was 77 this morning.  She did not use her oxygen  last night.  She has an inhaler at home but has not used it. Chest pain is described as tightness like her lungs are not fully expanding.  No fever, chills, body aches or night sweats.  Patient is tachycardic here today.   Shortness of Breath   Past Medical History:  Diagnosis Date   Acquired thrombophilia (HCC) 05/30/2021   Anemia 07/08/2015   Anxiety    Arteriosclerosis of both carotid arteries 07/08/2015   Arthritis    B12 deficiency    Bilateral bunions 07/08/2015   Bilateral carotid artery stenosis 08/11/2018   BMI 36.0-36.9,adult    Cancer (HCC)    skin cancer   Cardiac murmur 03/12/2022   Carotid artery stenosis    Chronic intermittent hypoxia with obstructive sleep apnea 06/22/2020   Chronic low back pain 07/08/2015   Chronic respiratory failure with hypoxia (HCC) 10/09/2021   Claudication (HCC)    COPD (chronic obstructive pulmonary disease) (HCC)    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/10/2018   Corn or callus 08/28/2017   Coronary arteriosclerosis 05/30/2021   Coronary artery disease    COVID-19 virus infection 11/21/2018   CPAP/BiPAP dependence 07/08/2015   Degenerative lumbar spinal stenosis 03/02/2020   Depression    Diabetes mellitus without complication (HCC)    Diabetic polyneuropathy associated with type 2 diabetes mellitus  (HCC) 11/10/2018   DOE (dyspnea on exertion) 03/12/2022   Fibromyalgia    Fibromyalgia    GERD (gastroesophageal reflux disease)    Heart disease    Hyperlipidemia associated with type 2 diabetes mellitus (HCC)    Hypertension    IBS (irritable bowel syndrome)    Innominate artery stenosis (HCC) 07/08/2015   Formatting of this note might be different from the original. Open stent in 11/2012 and restent in 07/2013   Intolerance of continuous positive airway pressure (CPAP) ventilation 11/10/2018   Lumbar radiculopathy 12/29/2020   Lumbar spondylosis 01/25/2021   Macular degeneration    Neuropathic pain of both feet 07/08/2015   Nodule of right lung 07/08/2015   Obesity 07/08/2015   Obesity (BMI 35.0-39.9 without comorbidity) 03/12/2022   Obstructive sleep apnea 02/19/2019   Osteopenia 12/08/2018   PAD (peripheral artery disease) (HCC) 11/10/2018   Peripheral vascular angioplasty status with implants and grafts 07/08/2015   Peripheral vascular disease (HCC) 03/02/2020   Primary fibromyalgia syndrome 03/02/2020   Recurrent insomnia 08/13/2019   Restless legs syndrome (RLS) 02/21/2016   Spondylolisthesis, grade 1 06/19/2020   Statin intolerance    Status post arterial stent 11/10/2018   Status post insertion of hypoglossal nerve stimulator 09/11/2021   Status post insertion of nerve stimulator 06/22/2020   Type 2 diabetes mellitus with diabetic mononeuropathy, without long-term current use of insulin  (HCC) 12/05/2015   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, without long-term current use of insulin  (HCC) 11/10/2018    Patient Active Problem List   Diagnosis Date Noted   CAP (community acquired pneumonia) 11/11/2023   ILD (interstitial lung disease) (HCC) 09/22/2023   OSA (obstructive sleep apnea) 09/22/2023   Pulmonary fibrosis (HCC) 08/15/2022   Obesity (BMI 35.0-39.9 without comorbidity) 03/12/2022   DOE (dyspnea on exertion) 03/12/2022    Cardiac murmur 03/12/2022   Anxiety 02/08/2022   Arthritis 02/08/2022   B12 deficiency 02/08/2022   BMI 36.0-36.9,adult 02/08/2022   Cancer (HCC) 02/08/2022   Carotid artery stenosis 02/08/2022   Claudication (HCC) 02/08/2022   Coronary artery disease 02/08/2022   Depression 02/08/2022   Diabetes mellitus without complication (HCC) 02/08/2022   Fibromyalgia 02/08/2022   GERD (gastroesophageal reflux disease) 02/08/2022   Heart disease 02/08/2022   Hyperlipidemia associated with type 2 diabetes mellitus (HCC) 02/08/2022   Hypertension 02/08/2022   IBS (irritable bowel syndrome) 02/08/2022   Macular degeneration 02/08/2022   Statin intolerance 02/08/2022   Chronic respiratory failure with hypoxia (HCC) 10/09/2021   Status post insertion of hypoglossal nerve stimulator 09/11/2021   Acquired thrombophilia (HCC) 05/30/2021   Coronary arteriosclerosis 05/30/2021   Lumbar spondylosis 01/25/2021   Lumbar radiculopathy 12/29/2020   Chronic intermittent hypoxia with obstructive sleep apnea 06/22/2020   Status post insertion of nerve stimulator 06/22/2020   Spondylolisthesis, grade 1 06/19/2020   Degenerative lumbar spinal stenosis 03/02/2020   Primary fibromyalgia syndrome 03/02/2020   Peripheral vascular disease (HCC) 03/02/2020   Recurrent insomnia 08/13/2019   Obstructive sleep apnea 02/19/2019   Osteopenia 12/08/2018   COVID-19 virus infection 11/21/2018   Intolerance of continuous positive airway pressure (CPAP) ventilation 11/10/2018   PAD (peripheral artery disease) (HCC) 11/10/2018   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, without long-term current use of insulin  (HCC) 11/10/2018   Diabetic polyneuropathy associated with type 2 diabetes mellitus (HCC) 11/10/2018   Status post arterial stent 11/10/2018   Bilateral carotid artery stenosis 08/11/2018   Corn or callus 08/28/2017   Restless legs syndrome (RLS) 02/21/2016   Type 2 diabetes  mellitus with diabetic mononeuropathy, without long-term current use of insulin  (HCC) 12/05/2015   Anemia 07/08/2015   Bilateral bunions 07/08/2015   CPAP/BiPAP dependence 07/08/2015   Neuropathic pain of both feet 07/08/2015   Nodule of right lung 07/08/2015   Obesity 07/08/2015   Arteriosclerosis of both carotid arteries 07/08/2015   Innominate artery stenosis (HCC) 07/08/2015   Chronic low back pain 07/08/2015   Peripheral vascular angioplasty status with implants and grafts 07/08/2015    Past Surgical History:  Procedure Laterality Date   CARDIAC CATHETERIZATION     DRUG INDUCED ENDOSCOPY N/A 02/19/2019   Procedure: DRUG INDUCED SLEEP ENDOSCOPY;  Surgeon: Mable Lenis, MD;  Location: [facility];  Service: ENT;  Laterality: N/A;   EYE SURGERY Bilateral    cataract   IMPLANTATION OF HYPOGLOSSAL NERVE STIMULATOR Right 06/30/2019   Procedure: IMPLANTATION OF HYPOGLOSSAL NERVE STIMULATOR;  Surgeon: Mable Lenis, MD;  Location: [facility] OR;  Service: ENT;  Laterality: Right;  Neck, chest and lower lateral chest   SKIN BIOPSY     SMALL INTESTINE SURGERY     TUBAL LIGATION     two carotid stent      OB History   No obstetric history on file.      Home Medications    Prior to Admission medications   Medication Sig Start Date End Date Taking?  Authorizing Provider  albuterol  (VENTOLIN  HFA) 108 (90 Base) MCG/ACT inhaler INHALE 1-2 PUFFS INTO THE LUNGS EVERY 6 HOURS AS NEEDED. 09/30/23   Jude Harden GAILS, MD  amLODipine  (NORVASC ) 10 MG tablet Take 10 mg by mouth daily.    [provider]  atorvastatin (LIPITOR) 40 MG tablet Take 40 mg by mouth. 09/17/23   [provider]  clopidogrel  (PLAVIX ) 75 MG tablet Take 1 tablet (75 mg total) by mouth daily. Restart plavix  on 07/03/19 07/03/19   Mable Lenis, MD  Coenzyme Q10 (COQ10) 100 MG CAPS Take 100 mg by mouth daily.    [provider]  diclofenac Sodium (VOLTAREN) 1 % GEL Apply 1 Application  topically as needed for pain.    [provider]  estradiol (ESTRACE) 0.1 MG/GM vaginal cream Place 1 Applicatorful vaginally once a week. 03/29/22   [provider]  ezetimibe  (ZETIA ) 10 MG tablet Take 10 mg by mouth daily. 12/27/21   [provider]  FARXIGA 5 MG TABS tablet Take 5 mg by mouth every morning. 09/12/21   [provider]  fluticasone  (FLONASE ) 50 MCG/ACT nasal spray Place 1 spray into both nostrils daily. 01/31/22   [provider]  gabapentin (NEURONTIN) 100 MG capsule Take 200 mg by mouth at bedtime. 01/31/22   [provider]  meclizine (ANTIVERT) 25 MG tablet Take 25-50 mg by mouth 2 (two) times daily. 02/23/22   [provider]  metoprolol succinate (TOPROL-XL) 100 MG 24 hr tablet Take 100 mg by mouth daily. 02/11/22   [provider]  nitroGLYCERIN  (NITROSTAT ) 0.4 MG SL tablet Place 0.4 mg under the tongue every 5 (five) minutes as needed for chest pain.    [provider]  OZEMPIC, 0.25 OR 0.5 MG/DOSE, 2 MG/3ML SOPN Inject 0.25 mg into the skin once a week. 02/11/22   [provider]  pantoprazole  (PROTONIX ) 40 MG tablet Take 40 mg by mouth every morning.  08/02/18   [provider]  RESTASIS  0.05 % ophthalmic emulsion Place 1 drop into both eyes 2 (two) times daily. 02/11/22   [provider]  venlafaxine  XR (EFFEXOR -XR) 75 MG 24 hr capsule Take 75 mg by mouth at bedtime.  09/08/18   [provider]    Family History Family History  Problem Relation Age of Onset   Hypertension Mother    Heart failure Mother    Diabetes Mother    Arthritis Mother    Heart attack Father    Diabetes Sister    Skin cancer Sister    Pancreatic cancer Brother    Diabetes Maternal Grandmother     Social History Social History   Tobacco Use   Smoking status: Former    Types: Cigarettes    Passive exposure: Past   Smokeless tobacco: Never   Tobacco comments:    Quit 29 years ago.  [facility] 11/11/2023  Vaping Use   Vaping status: Never Used  Substance Use Topics   Alcohol use: Not Currently   Drug use: Never     Allergies   Penicillins, Asa [aspirin], Toradol  [ketorolac  tromethamine ], Morphine  and codeine, and Tramadol   Review of Systems Review of Systems  Respiratory:  Positive for shortness of breath.    See HPI  Physical Exam Triage Vital Signs ED Triage Vitals  Encounter Vitals Group     BP 02/02/24 1344 (!) 147/82     Girls Systolic BP Percentile --      Girls Diastolic BP Percentile --  Boys Systolic BP Percentile --      Boys Diastolic BP Percentile --      Pulse Rate 02/02/24 1344 (!) 118     Resp 02/02/24 1344 (!) 22     Temp 02/02/24 1344 98.1 F (36.7 C)     Temp Source 02/02/24 1344 Oral     SpO2 02/02/24 1344 93 %     Weight --      Height --      Head Circumference --      Peak Flow --      Pain Score 02/02/24 1343 5     Pain Loc --      Pain Education --      Exclude from Growth Chart --    No data found.  Updated Vital Signs BP (!) 147/82 (BP Location: Left Arm)   Pulse (!) 118   Temp 98.1 F (36.7 C) (Oral)   Resp (!) 22   SpO2 93%   Visual Acuity Right Eye Distance:   Left Eye Distance:   Bilateral Distance:    Right Eye Near:   Left Eye Near:    Bilateral Near:     Physical Exam Vitals and nursing note reviewed.  Constitutional:      General: She is not in acute distress.    Appearance: She is ill-appearing. She is not toxic-appearing.  HENT:     Mouth/Throat:     Pharynx: Oropharynx is clear.  Eyes:     Conjunctiva/sclera: Conjunctivae normal.  Cardiovascular:     Rate and Rhythm: Tachycardia present.  Pulmonary:     Effort: Pulmonary effort is normal. No accessory muscle usage or respiratory distress.     Breath sounds: Examination of the right-upper field reveals decreased breath sounds. Examination of the left-upper field reveals decreased breath sounds. Examination of the right-middle  field reveals decreased breath sounds. Examination of the left-middle field reveals decreased breath sounds. Examination of the right-lower field reveals decreased breath sounds. Examination of the left-lower field reveals decreased breath sounds. Decreased breath sounds present.  Musculoskeletal:        General: Normal range of motion.     Cervical back: Normal range of motion.  Skin:    General: Skin is warm and dry.  Neurological:     General: No focal deficit present.     Mental Status: She is alert.  Psychiatric:        Mood and Affect: Mood normal.      UC Treatments / Results  Labs (all labs ordered are listed, but only abnormal results are displayed) Labs Reviewed  POC SARS CORONAVIRUS 2 AG -  ED    EKG   Radiology DG Chest 2 View Result Date: 02/02/2024 CLINICAL DATA:  chest pain, SOB EXAM: CHEST - 2 VIEW COMPARISON:  CT chest 08/13/2022. chest x-ray 12/16/2023 FINDINGS: The heart and mediastinal contours are unchanged with similar left hilar prominence. Atherosclerotic plaque. No focal consolidation. Chronic coarsened interstitial markings most prominent along the upper lobes. No overt pulmonary edema. No pleural effusion. No pneumothorax. No acute osseous abnormality. IMPRESSION: No acute cardiopulmonary abnormality. Persistent left hilar prominence and bilateral upper lobe pulmonary fibrosis/interstitial lung disease. Electronically Signed   By: Morgane  Naveau M.D.   On: 02/02/2024 14:08    Procedures Procedures (including critical care time)  Medications Ordered in UC Medications - No data to display  Initial Impression / Assessment and Plan / UC Course  I have reviewed the triage vital signs and the  nursing notes.  Pertinent labs & imaging results that were available during my care of the patient were reviewed by me and considered in my medical decision making (see chart for details).  Clinical Course as of 02/03/24 0839  Mon Feb 02, 2024  1617 POC SARS  Coronavirus 2 Ag-ED - Nasal Swab Negative  [TB]    Clinical Course User Index [TB] Adah Corning A, FNP    Shortness of breath and chest pain-patient EKG reviewed with sinus tachycardia but otherwise normal.  No acute ST changes. X-ray without any concerns for pneumonia.  Persistent left hilar prominence and bilateral upper lobe pulmonary fibrosis/interstitial lung disease was noted. COVID testing negative Not exactly sure of cause of tachycardia and shortness of breath at this time but did speak with patient about potential viral illness versus PE.  PE less likely due to being on blood thinners.She is currently on Plavix . Still in the differentials.  Recommend go home, rest and hydrate.  Make sure she is using her oxygen .  If the problem worsens she will need to go to the ER.  Daughter is with her and aware of plan. Final Clinical Impressions(s) / UC Diagnoses   Final diagnoses:  SOB (shortness of breath)     Discharge Instructions      Your xray did not show any pneumonia. This may be viral illness.  If the symptoms worsen I would go to the ER for CT scan of the chest.  Otherwise see you doctor for follow up.     ED Prescriptions   None    PDMP not reviewed this encounter.   Adah Corning LABOR, FNP 02/03/24 [phone]

## 2024-02-12 ENCOUNTER — Encounter: Payer: Self-pay | Admitting: Cardiology

## 2024-02-12 ENCOUNTER — Ambulatory Visit: Attending: Cardiology | Admitting: Cardiology

## 2024-02-12 VITALS — BP 138/70 | HR 114 | Ht 61.0 in | Wt 167.2 lb

## 2024-02-12 DIAGNOSIS — G4733 Obstructive sleep apnea (adult) (pediatric): Secondary | ICD-10-CM

## 2024-02-12 DIAGNOSIS — E119 Type 2 diabetes mellitus without complications: Secondary | ICD-10-CM | POA: Diagnosis not present

## 2024-02-12 DIAGNOSIS — I2510 Atherosclerotic heart disease of native coronary artery without angina pectoris: Secondary | ICD-10-CM | POA: Diagnosis not present

## 2024-02-12 DIAGNOSIS — E113213 Type 2 diabetes mellitus with mild nonproliferative diabetic retinopathy with macular edema, bilateral: Secondary | ICD-10-CM | POA: Diagnosis not present

## 2024-02-12 NOTE — Progress Notes (Signed)
 Cardiology Office Note:    Date:  02/12/2024   ID:  Lori Byrd, DOB [birth year], MRN [id number]  PCP:  Jama Chow, MD  Cardiologist:  Jennifer JONELLE Crape, MD   Referring MD: Jama Chow, MD    ASSESSMENT:    1. Coronary artery disease involving native coronary artery of native heart without angina pectoris   2. OSA (obstructive sleep apnea)   3. Diabetes mellitus without complication (HCC)   4. 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  Importance of compliance with diet medication stressed and patient verbalized standing. Essential hypertension: Blood pressure stable and diet was emphasized. Mixed dyslipidemia: On lipid-lowering medications followed by primary care.  Goal LDL must be less than 60. Pulmonary fibrosis: Followed by pulmonary doctors and she appears to be stable according to her history Patient will be seen in follow-up appointment in 6 months or earlier if the patient has any concerns.    Medication Adjustments/Labs and Tests Ordered: Current medicines are reviewed at length with the patient today.  Concerns regarding medicines are outlined above.  Orders Placed This Encounter  Procedures   EKG 12-Lead   No orders of the defined types were placed in this encounter.    No chief complaint on file.    History of Present Illness:    Lori Byrd is a 73 y.o. female.  Patient has past medical history of coronary artery disease, essential hypertension, mixed dyslipidemia and she is recently diagnosed to have pulmonary fibrosis.  She denies any problems at this time and takes care of activities of daily living.  No chest pain orthopnea or PND.  At the time of my evaluation, the patient is alert awake oriented and  in no distress.  She ambulates with a cane  Past Medical History:  Diagnosis Date   Acquired thrombophilia (HCC) 05/30/2021   Anemia 07/08/2015   Anxiety    Arteriosclerosis of both carotid arteries 07/08/2015   Arthritis    B12 deficiency    Bilateral bunions 07/08/2015   Bilateral carotid artery stenosis 08/11/2018   BMI 36.0-36.9,adult    Cancer (HCC)    skin cancer   CAP (community acquired pneumonia) 11/11/2023   Cardiac murmur 03/12/2022   Carotid artery stenosis    Chronic intermittent hypoxia with obstructive sleep apnea 06/22/2020   Chronic low back pain 07/08/2015   Chronic respiratory failure with hypoxia (HCC) 10/09/2021   Claudication (HCC)    Corn or callus 08/28/2017   Coronary arteriosclerosis 05/30/2021   Coronary artery disease    COVID-19 virus infection 11/21/2018   CPAP/BiPAP dependence 07/08/2015   Degenerative lumbar spinal stenosis 03/02/2020   Depression    Diabetes mellitus without complication (HCC)    Diabetic polyneuropathy associated with type 2 diabetes mellitus (HCC) 11/10/2018   DOE (dyspnea on exertion) 03/12/2022   Fibromyalgia    Fibromyalgia    GERD (gastroesophageal reflux disease)    Heart disease    Hyperlipidemia associated with type 2 diabetes mellitus (HCC)    Hypertension    IBS (irritable bowel syndrome)    ILD (interstitial lung disease) (HCC) 09/22/2023   Innominate artery stenosis (HCC) 07/08/2015   Formatting of this note might be different from the original. Open stent in 11/2012 and restent in 07/2013  Intolerance of continuous positive airway pressure (CPAP) ventilation 11/10/2018   Lumbar radiculopathy 12/29/2020   Lumbar spondylosis 01/25/2021   Macular degeneration    Neuropathic pain of both feet 07/08/2015   Nodule of right lung 07/08/2015   Obesity 07/08/2015   Obesity (BMI 35.0-39.9 without comorbidity) 03/12/2022   Obstructive sleep apnea 02/19/2019   OSA (obstructive sleep apnea) 09/22/2023   Osteopenia  12/08/2018   PAD (peripheral artery disease) (HCC) 11/10/2018   Peripheral vascular angioplasty status with implants and grafts 07/08/2015   Peripheral vascular disease (HCC) 03/02/2020   Primary fibromyalgia syndrome 03/02/2020   Pulmonary fibrosis (HCC) 08/15/2022   Recurrent insomnia 08/13/2019   Restless legs syndrome (RLS) 02/21/2016   Spondylolisthesis, grade 1 06/19/2020   Statin intolerance    Status post arterial stent 11/10/2018   Status post insertion of hypoglossal nerve stimulator 09/11/2021   Status post insertion of nerve stimulator 06/22/2020   Type 2 diabetes mellitus with diabetic mononeuropathy, without long-term current use of insulin  (HCC) 12/05/2015   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, without long-term current use of insulin  (HCC) 11/10/2018    Past Surgical History:  Procedure Laterality Date   CARDIAC CATHETERIZATION     DRUG INDUCED ENDOSCOPY N/A 02/19/2019   Procedure: DRUG INDUCED SLEEP ENDOSCOPY;  Surgeon: Mable Lenis, MD;  Location:  SURGERY CENTER;  Service: ENT;  Laterality: N/A;   EYE SURGERY Bilateral    cataract   IMPLANTATION OF HYPOGLOSSAL NERVE STIMULATOR Right 06/30/2019   Procedure: IMPLANTATION OF HYPOGLOSSAL NERVE STIMULATOR;  Surgeon: Mable Lenis, MD;  Location: [facility] OR;  Service: ENT;  Laterality: Right;  Neck, chest and lower lateral chest   SKIN BIOPSY     SMALL INTESTINE SURGERY     TUBAL LIGATION     two carotid stent      Current Medications: Current Meds  Medication Sig   albuterol  (VENTOLIN  HFA) 108 (90 Base) MCG/ACT inhaler INHALE 1-2 PUFFS INTO THE LUNGS EVERY 6 HOURS AS NEEDED.   atorvastatin (LIPITOR) 40 MG tablet Take 40 mg by mouth daily.   cilostazol (PLETAL) 50 MG tablet Take 50 mg by mouth 2 (two) times daily.   clopidogrel  (PLAVIX ) 75 MG tablet Take 1 tablet (75 mg total) by mouth daily. Restart plavix  on 07/03/19   Coenzyme Q10 (COQ10) 100 MG CAPS Take 100 mg  by mouth daily.   diclofenac Sodium (VOLTAREN) 1 % GEL Apply 1 Application topically as needed for pain.   estradiol (ESTRACE) 0.1 MG/GM vaginal cream Place 1 Applicatorful vaginally once a week.   ezetimibe  (ZETIA ) 10 MG tablet Take 10 mg by mouth daily.   FARXIGA 5 MG TABS tablet Take 5 mg by mouth every morning.   fluticasone  (FLONASE ) 50 MCG/ACT nasal spray Place 1 spray into both nostrils daily.   gabapentin (NEURONTIN) 100 MG capsule Take 100 mg by mouth at bedtime.   meclizine (ANTIVERT) 25 MG tablet Take 25-50 mg by mouth as needed for dizziness or nausea.   metoprolol succinate (TOPROL-XL) 100 MG 24 hr tablet Take 100 mg by mouth daily.   montelukast (SINGULAIR) 10 MG tablet Take 10 mg by mouth at bedtime.   nitroGLYCERIN  (NITROSTAT ) 0.4 MG SL tablet Place 0.4 mg under the tongue every 5 (five) minutes as needed for chest pain.   OZEMPIC, 0.25 OR 0.5 MG/DOSE, 2 MG/3ML SOPN Inject 0.25 mg into the skin once a week.   pantoprazole  (PROTONIX ) 40 MG tablet Take 40 mg by mouth every  morning.    venlafaxine  XR (EFFEXOR -XR) 75 MG 24 hr capsule Take 75 mg by mouth at bedtime.      Allergies:   Penicillins, Asa [aspirin], Toradol  [ketorolac  tromethamine ], Morphine  and codeine, and Tramadol   Social History   Socioeconomic History   Marital status: Unknown    Spouse name: Not on file   Number of children: Not on file   Years of education: Not on file   Highest education level: Not on file  Occupational History   Not on file  Tobacco Use   Smoking status: Former    Types: Cigarettes    Passive exposure: Past   Smokeless tobacco: Never   Tobacco comments:    Quit 29 years ago. [facility] 11/11/2023  Vaping Use   Vaping status: Never Used  Substance and Sexual Activity   Alcohol use: Not Currently   Drug use: Never   Sexual activity: Not on file  Other Topics Concern   Not on file  Social History Narrative   Not on file   Social Drivers of Health   Financial Resource  Strain: Low Risk  (09/19/2023)   Received from [facility]   Overall Financial Resource Strain (CARDIA)    Difficulty of Paying Living Expenses: Not hard at all  Food Insecurity: Low Risk  (10/13/2023)   Received from [facility]   Hunger Vital Sign    Within the past 12 months, you worried that your food would run out before you got money to buy more: Never true    Within the past 12 months, the food you bought just didn't last and you didn't have money to get more. : Never true  Transportation Needs: No Transportation Needs (10/13/2023)   Received from [facility]    In the past 12 months, has lack of reliable transportation kept you from medical appointments, meetings, work or from getting things needed for daily living? : No  Physical Activity: Not on file  Stress: No Stress Concern Present (09/15/2023)   Received from [facility] - Occupational Stress Questionnaire    Feeling of Stress : Only a little  Social Connections: Unknown (01/29/2022)   Received from [facility]   Social Network    Social Network: Not on file     Family History: The patient's family history includes Arthritis in her mother; Diabetes in her maternal grandmother, mother, and sister; Heart attack in her father; Heart failure in her mother; Hypertension in her mother; Pancreatic cancer in her brother; Skin cancer in her sister.  ROS:   Please see the history of present illness.    All other systems reviewed and are negative.  EKGs/Labs/Other Studies Reviewed:    The following studies were reviewed today: .SABRAEKG Interpretation Date/Time:  Thursday February 12 2024 16:25:55 EDT Ventricular Rate:  114 PR Interval:  150 QRS Duration:  80 QT Interval:  316 QTC Calculation: 435 R Axis:   37  Text Interpretation: Sinus tachycardia Inferior infarct , age undetermined Abnormal ECG When compared with ECG of 02-Feb-2024 14:09, QRS axis Shifted right  Inferior infarct is now Present Confirmed by Edwyna Backers [phone] on 02/12/2024 4:40:19 PM     Recent Labs: No results found for requested labs within last 365 days.  Recent Lipid Panel No results found for: CHOL, TRIG, HDL, CHOLHDL, VLDL, LDLCALC, LDLDIRECT  Physical Exam:    VS:  BP 138/70   Pulse (!) 114   Ht  5' 1 (1.549 m)   Wt 167 lb 3.2 oz (75.8 kg)   SpO2 91%   BMI 31.59 kg/m     Wt Readings from Last 3 Encounters:  02/12/24 167 lb 3.2 oz (75.8 kg)  12/16/23 176 lb 12.8 oz (80.2 kg)  11/11/23 182 lb 12.8 oz (82.9 kg)     GEN: Patient is in no acute distress HEENT: Normal NECK: No JVD; No carotid bruits LYMPHATICS: No lymphadenopathy CARDIAC: Hear sounds regular, 2/6 systolic murmur at the apex. RESPIRATORY:  Clear to auscultation without rales, wheezing or rhonchi  ABDOMEN: Soft, non-tender, non-distended MUSCULOSKELETAL:  No edema; No deformity  SKIN: Warm and dry NEUROLOGIC:  Alert and oriented x 3 PSYCHIATRIC:  Normal affect   Signed, Jennifer JONELLE Crape, MD  02/12/2024 4:44 PM    [facility]

## 2024-02-12 NOTE — Patient Instructions (Signed)
 Medication Instructions:  Your physician recommends that you continue on your current medications as directed. Please refer to the Current Medication list given to you today.  *If you need a refill on your cardiac medications before your next appointment, please call your pharmacy*  Lab Work: None If you have labs (blood work) drawn today and your tests are completely normal, you will receive your results only by: MyChart Message (if you have MyChart) OR A paper copy in the mail If you have any lab test that is abnormal or we need to change your treatment, we will call you to review the results.  Testing/Procedures: None  Follow-Up: At [facility], you and your health needs are our priority.  As part of our continuing mission to provide you with exceptional heart care, our providers are all part of one team.  This team includes your primary Cardiologist (physician) and Advanced Practice Providers or APPs (Physician Assistants and Nurse Practitioners) who all work together to provide you with the care you need, when you need it.  Your next appointment:   9 month(s)  Provider:   Belva Crome, MD    We recommend signing up for the patient portal called "MyChart".  Sign up information is provided on this After Visit Summary.  MyChart is used to connect with patients for Virtual Visits (Telemedicine).  Patients are able to view lab/test results, encounter notes, upcoming appointments, etc.  Non-urgent messages can be sent to your provider as well.   To learn more about what you can do with MyChart, go to [email or web address].   Other Instructions None

## 2024-02-19 NOTE — Telephone Encounter (Signed)
 Form sent to Scan.

## 2024-03-03 ENCOUNTER — Other Ambulatory Visit: Payer: Self-pay | Admitting: Pulmonary Disease

## 2024-03-11 ENCOUNTER — Other Ambulatory Visit (HOSPITAL_BASED_OUTPATIENT_CLINIC_OR_DEPARTMENT_OTHER): Payer: Self-pay | Admitting: Internal Medicine

## 2024-03-11 DIAGNOSIS — Z1231 Encounter for screening mammogram for malignant neoplasm of breast: Secondary | ICD-10-CM

## 2024-03-12 ENCOUNTER — Ambulatory Visit (HOSPITAL_BASED_OUTPATIENT_CLINIC_OR_DEPARTMENT_OTHER)
Admission: RE | Admit: 2024-03-12 | Discharge: 2024-03-12 | Disposition: A | Discharge status: Home / Self Care | Source: Ambulatory Visit | Attending: Internal Medicine | Admitting: Internal Medicine

## 2024-03-12 DIAGNOSIS — Z1231 Encounter for screening mammogram for malignant neoplasm of breast: Secondary | ICD-10-CM | POA: Diagnosis not present

## 2024-04-15 ENCOUNTER — Ambulatory Visit (HOSPITAL_BASED_OUTPATIENT_CLINIC_OR_DEPARTMENT_OTHER): Admitting: Pulmonary Disease

## 2024-04-15 ENCOUNTER — Telehealth: Payer: Self-pay | Admitting: *Deleted

## 2024-04-15 ENCOUNTER — Encounter (HOSPITAL_BASED_OUTPATIENT_CLINIC_OR_DEPARTMENT_OTHER): Payer: Self-pay | Admitting: Pulmonary Disease

## 2024-04-15 VITALS — BP 129/79 | HR 94 | Ht 61.0 in | Wt 161.4 lb

## 2024-04-15 DIAGNOSIS — J9611 Chronic respiratory failure with hypoxia: Secondary | ICD-10-CM

## 2024-04-15 DIAGNOSIS — J849 Interstitial pulmonary disease, unspecified: Secondary | ICD-10-CM | POA: Diagnosis not present

## 2024-04-15 NOTE — Telephone Encounter (Signed)
 CRM signed by Madelin Stank, NP. Ready to be faxed. NFN

## 2024-04-15 NOTE — Patient Instructions (Addendum)
 X HRCT chest  X amb sat   VISIT SUMMARY: Today, we reassessed your oxygen  therapy and discussed your pulmonary fibrosis and sleep apnea management. You were accompanied by your daughter, [name]. We reviewed your current treatments and planned further evaluations to ensure your conditions are managed effectively.  YOUR PLAN: -PULMONARY FIBROSIS WITH UPPER LOBE PREDOMINANCE: Pulmonary fibrosis is a condition where the lung tissue becomes scarred and stiff, making it difficult to breathe. Your scarring is more pronounced in the left upper lobe. We will order a high-resolution CT scan of your chest to check if the fibrosis has progressed since your last scan.  -LONG-TERM SUPPLEMENTAL OXYGEN  THERAPY: You use oxygen  therapy at night to help with your breathing. We will perform an ambulatory saturation test to see if you need to use oxygen  during the day and to requalify you for the therapy.  -OBSTRUCTIVE SLEEP APNEA, STATUS POST INSPIRE DEVICE: Obstructive sleep apnea is a condition where your breathing stops and starts during sleep. You use an Inspire device to manage this. We will evaluate the settings of your Inspire device at your next appointment to ensure it is working correctly and that you are using it as prescribed.  INSTRUCTIONS: Please schedule a high-resolution CT scan of your chest and an ambulatory saturation test. We will review the results to determine the next steps. Also, ensure you bring your Inspire device to your next appointment for a settings evaluation.                      Contains text generated by Abridge.                                 Contains text generated by Abridge.

## 2024-04-15 NOTE — Telephone Encounter (Signed)
 Received CMN from [employer] regarding Bipap.  Placed in CMN folder for signature.

## 2024-04-15 NOTE — Progress Notes (Signed)
 Subjective:    Patient ID: Lori Byrd, female    DOB: [birth year], 73 y.o.   MRN: [id number]   73 yo ex- smoker followed for ILD and chronic respiratory failure  Initially referred by neurology for evaluation of nocturnal hypoxemia, subseq found to have ILD   PMH - OSA,CPAP intolerant status post inspire device 2021  -CAD s/p stents x 2 - She smoked 1 pack a day for 30 years.  She quit 26 years ago.   Discussed the use of AI scribe software for clinical note transcription with the patient, who gave verbal consent to proceed.  History of Present Illness Lori Byrd is a 73 year old female with pulmonary fibrosis who presents for reassessment of oxygen  therapy. She is accompanied by her daughter, Lori Byrd. She was referred by a neurologist for evaluation of dropping oxygen  levels at night.  She has pulmonary fibrosis with scarring evident on a lung scan and has experienced multiple episodes of pneumonia. She uses oxygen  therapy nightly at two liters and has a portable oxygen  tank, though usage is less frequent than recommended.  She uses the [facility] device for sleep apnea after issues with CPAP-related pneumonia. She is compliant with the device despite occasional poor sleep and a past incident with a dead remote battery.  She has a 30-year smoking history but quit many years ago.     Significant tests/ events reviewed 09/2021 ambulatory saturation, desat to 86% on first lap 11/2021 desat to 85% on second lap, recovered with 2 L of oxygen  04/15/24 desat to 88% on walking, improves with resting   12/09/2023 PFT: FVC 90, FEV1 102, ratio 85, DLC 76, DLCO 71     PFTs 11/2021 no airway obstruction with FEV1 95%, ratio 82, TLC 87% and DLCO 11.80/67%   Echo 10/2021 normal RVSP, decreased RV systolic function , LVH with small LV outflow gradient, no S.A.M.   HST 6/ 2020 showed severe sleep apnea with AHI at 53/hour with frequent episodes of hypoxemia sleep apnea is worst in  the prone position with AHI at 65.8/hour   ONO 09/13/21.  prolonged periods of hypoxemia.  Total time at or below 88% oxygenation was 354 minutes consecutive hypoxemia was present with the longest time at 27 minutes.   08/14/2022 HRCT chest: upper lobe predominant subpleural reticular opacities with traction btx and honeycombing changes. Slow progression since 2005 alternate Not UIP. 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 Stable 5 mm RLL nodule.  09/2023 serologies negative, HP negative     Review of Systems  neg for any significant sore throat, dysphagia, itching, sneezing, nasal congestion or excess/ purulent secretions, fever, chills, sweats, unintended wt loss, pleuritic or exertional cp, hempoptysis, orthopnea pnd or change in chronic leg swelling. Also denies presyncope, palpitations, heartburn, abdominal pain, nausea, vomiting, diarrhea or change in bowel or urinary habits, dysuria,hematuria, rash, arthralgias, visual complaints, headache, numbness weakness or ataxia.      Objective:   Physical Exam  Gen. Pleasant, well-nourished, in no distress ENT - no thrush, no pallor/icterus,no post nasal drip Neck: No JVD, no thyromegaly, no carotid bruits Lungs: no use of accessory muscles, no dullness to percussion, clear without rales or rhonchi  Cardiovascular: Rhythm regular, heart sounds  normal, no murmurs or gallops, no peripheral edema Musculoskeletal: No deformities, no cyanosis or clubbing        Assessment & Plan:   Assessment and Plan Assessment & Plan Pulmonary fibrosis with upper lobe predominance Pulmonary fibrosis with upper lobe predominance, characterized  by asymmetrical scarring more pronounced in the left upper lobe. The pattern does not align with idiopathic pulmonary fibrosis and is not linked to inflammatory conditions such as lupus or rheumatoid arthritis. Previous imaging suggests progression, but current status requires reassessment. - Order high-resolution CT  chest to assess progression of fibrosis. - Review CT results to determine if there has been progression since last year.  Long-term supplemental oxygen  therapy Long-term supplemental oxygen  therapy used nightly at a setting of two. She possesses a portable oxygen  device but lacks consistent daytime use. Reports feeling better since last year, necessitating objective reassessment of oxygen  requirements and requalification. - Perform ambulatory saturation test to assess need for daytime oxygen  and requalification >> no need  Obstructive sleep apnea, status post Inspire device Obstructive sleep apnea managed with an Inspire device, used nightly except for an instance of a dead battery. Uncertainty exists regarding the need for device setting adjustments. No recent check-up or sleep study due to prior stroke hospitalization. Device appears functional, but settings require evaluation. - Evaluate Inspire device settings during next appointment. - Ensure compliance with Inspire device usage.

## 2024-04-28 ENCOUNTER — Ambulatory Visit (HOSPITAL_BASED_OUTPATIENT_CLINIC_OR_DEPARTMENT_OTHER)
Admission: RE | Admit: 2024-04-28 | Discharge: 2024-04-28 | Disposition: A | Source: Ambulatory Visit | Attending: Pulmonary Disease | Admitting: Pulmonary Disease

## 2024-04-28 DIAGNOSIS — J9611 Chronic respiratory failure with hypoxia: Secondary | ICD-10-CM | POA: Diagnosis not present

## 2024-04-28 DIAGNOSIS — J849 Interstitial pulmonary disease, unspecified: Secondary | ICD-10-CM

## 2024-05-04 ENCOUNTER — Telehealth: Payer: Self-pay | Admitting: *Deleted

## 2024-05-04 NOTE — Telephone Encounter (Signed)
 CMN faxed to [facility] [phone].  Nothing further needed.

## 2024-05-05 ENCOUNTER — Ambulatory Visit: Payer: Self-pay | Admitting: Pulmonary Disease

## 2024-05-31 ENCOUNTER — Ambulatory Visit (HOSPITAL_BASED_OUTPATIENT_CLINIC_OR_DEPARTMENT_OTHER): Admitting: Pulmonary Disease

## 2024-06-07 ENCOUNTER — Ambulatory Visit (HOSPITAL_BASED_OUTPATIENT_CLINIC_OR_DEPARTMENT_OTHER): Admitting: Pulmonary Disease
